# Patient Record
Sex: Male | Born: 1985
Health system: Southern US, Community
[De-identification: ages and names within clinical notes are randomized; demographics above are authoritative.]

## PROBLEM LIST (undated history)

## (undated) DIAGNOSIS — R251 Tremor, unspecified: Secondary | ICD-10-CM

## (undated) DIAGNOSIS — G8929 Other chronic pain: Secondary | ICD-10-CM

## (undated) DIAGNOSIS — F4322 Adjustment disorder with anxiety: Secondary | ICD-10-CM

## (undated) DIAGNOSIS — F319 Bipolar disorder, unspecified: Secondary | ICD-10-CM

## (undated) DIAGNOSIS — F419 Anxiety disorder, unspecified: Secondary | ICD-10-CM

## (undated) DIAGNOSIS — R1011 Right upper quadrant pain: Secondary | ICD-10-CM

## (undated) DIAGNOSIS — F445 Conversion disorder with seizures or convulsions: Secondary | ICD-10-CM

## (undated) DIAGNOSIS — G479 Sleep disorder, unspecified: Secondary | ICD-10-CM

## (undated) DIAGNOSIS — F1111 Opioid abuse, in remission: Secondary | ICD-10-CM

## (undated) DIAGNOSIS — S060XAA Concussion with loss of consciousness status unknown, initial encounter: Secondary | ICD-10-CM

## (undated) DIAGNOSIS — R109 Unspecified abdominal pain: Secondary | ICD-10-CM

## (undated) DIAGNOSIS — F988 Other specified behavioral and emotional disorders with onset usually occurring in childhood and adolescence: Secondary | ICD-10-CM

## (undated) DIAGNOSIS — S060X9A Concussion with loss of consciousness of unspecified duration, initial encounter: Secondary | ICD-10-CM

## (undated) DIAGNOSIS — R404 Transient alteration of awareness: Secondary | ICD-10-CM

## (undated) DIAGNOSIS — M549 Dorsalgia, unspecified: Secondary | ICD-10-CM

## (undated) DIAGNOSIS — Z8249 Family history of ischemic heart disease and other diseases of the circulatory system: Secondary | ICD-10-CM

## (undated) DIAGNOSIS — M5137 Other intervertebral disc degeneration, lumbosacral region: Secondary | ICD-10-CM

## (undated) DIAGNOSIS — R51 Headache: Secondary | ICD-10-CM

## (undated) DIAGNOSIS — F329 Major depressive disorder, single episode, unspecified: Secondary | ICD-10-CM

## (undated) DIAGNOSIS — E7800 Pure hypercholesterolemia, unspecified: Secondary | ICD-10-CM

## (undated) DIAGNOSIS — F339 Major depressive disorder, recurrent, unspecified: Secondary | ICD-10-CM

## (undated) DIAGNOSIS — R319 Hematuria, unspecified: Secondary | ICD-10-CM

## (undated) DIAGNOSIS — Z9089 Acquired absence of other organs: Secondary | ICD-10-CM

## (undated) DIAGNOSIS — R569 Unspecified convulsions: Secondary | ICD-10-CM

## (undated) HISTORY — DX: Family history of ischemic heart disease and other diseases of the circulatory system: Z82.49

## (undated) HISTORY — DX: Anxiety disorder, unspecified: F41.9

## (undated) HISTORY — PX: TONSILLECTOMY: SUR1361

## (undated) HISTORY — PX: TYMPANOSTOMY TUBE PLACEMENT: SHX32

## (undated) HISTORY — DX: Other intervertebral disc degeneration, lumbosacral region: M51.37

## (undated) HISTORY — DX: Acquired absence of other organs: Z90.89

## (undated) HISTORY — DX: Unspecified abdominal pain: R10.9

## (undated) HISTORY — DX: Right upper quadrant pain: R10.11

## (undated) HISTORY — DX: Other specified behavioral and emotional disorders with onset usually occurring in childhood and adolescence: F98.8

## (undated) HISTORY — DX: Pure hypercholesterolemia, unspecified: E78.00

## (undated) HISTORY — DX: Headache: R51

## (undated) HISTORY — DX: Concussion with loss of consciousness status unknown, initial encounter: S06.0XAA

## (undated) HISTORY — DX: Adjustment disorder with anxiety: F43.22

## (undated) HISTORY — DX: Major depressive disorder, recurrent, unspecified: F33.9

## (undated) HISTORY — DX: Hematuria, unspecified: R31.9

## (undated) HISTORY — DX: Conversion disorder with seizures or convulsions: F44.5

## (undated) HISTORY — DX: Transient alteration of awareness: R40.4

## (undated) HISTORY — DX: Major depressive disorder, single episode, unspecified: F32.9

## (undated) HISTORY — DX: Opioid abuse, in remission: F11.11

## (undated) HISTORY — DX: Bipolar disorder, unspecified: F31.9

## (undated) HISTORY — DX: Concussion with loss of consciousness of unspecified duration, initial encounter: S06.0X9A

## (undated) HISTORY — DX: Tremor, unspecified: R25.1

## (undated) HISTORY — DX: Sleep disorder, unspecified: G47.9

---
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
## 2008-04-21 DIAGNOSIS — G479 Sleep disorder, unspecified: Secondary | ICD-10-CM

## 2008-04-21 HISTORY — DX: Sleep disorder, unspecified: G47.9

## 2008-08-12 ENCOUNTER — Emergency Department (HOSPITAL_COMMUNITY): Admission: EM | Admit: 2008-08-12 | Discharge: 2008-08-12 | Payer: Self-pay | Admitting: Emergency Medicine

## 2009-11-05 DIAGNOSIS — F319 Bipolar disorder, unspecified: Secondary | ICD-10-CM

## 2009-11-05 HISTORY — DX: Bipolar disorder, unspecified: F31.9

## 2009-12-23 ENCOUNTER — Ambulatory Visit: Payer: Self-pay | Admitting: Diagnostic Radiology

## 2009-12-23 ENCOUNTER — Emergency Department (HOSPITAL_BASED_OUTPATIENT_CLINIC_OR_DEPARTMENT_OTHER): Admission: EM | Admit: 2009-12-23 | Discharge: 2009-12-23 | Payer: Self-pay | Admitting: Emergency Medicine

## 2009-12-26 ENCOUNTER — Emergency Department (HOSPITAL_BASED_OUTPATIENT_CLINIC_OR_DEPARTMENT_OTHER): Admission: EM | Admit: 2009-12-26 | Discharge: 2009-12-27 | Payer: Self-pay | Admitting: Emergency Medicine

## 2009-12-26 ENCOUNTER — Ambulatory Visit: Payer: Self-pay | Admitting: Radiology

## 2009-12-27 ENCOUNTER — Ambulatory Visit: Payer: Self-pay | Admitting: Diagnostic Radiology

## 2010-02-03 ENCOUNTER — Emergency Department (HOSPITAL_BASED_OUTPATIENT_CLINIC_OR_DEPARTMENT_OTHER): Admission: EM | Admit: 2010-02-03 | Discharge: 2010-02-03 | Payer: Self-pay | Admitting: Emergency Medicine

## 2010-02-03 ENCOUNTER — Ambulatory Visit: Payer: Self-pay | Admitting: Diagnostic Radiology

## 2010-04-21 ENCOUNTER — Emergency Department (HOSPITAL_BASED_OUTPATIENT_CLINIC_OR_DEPARTMENT_OTHER): Admission: EM | Admit: 2010-04-21 | Discharge: 2010-04-21 | Payer: Self-pay | Admitting: Emergency Medicine

## 2010-04-21 ENCOUNTER — Ambulatory Visit: Payer: Self-pay | Admitting: Diagnostic Radiology

## 2010-06-04 ENCOUNTER — Emergency Department (HOSPITAL_BASED_OUTPATIENT_CLINIC_OR_DEPARTMENT_OTHER): Admission: EM | Admit: 2010-06-04 | Discharge: 2010-06-05 | Payer: Self-pay | Admitting: Emergency Medicine

## 2010-06-04 ENCOUNTER — Ambulatory Visit: Payer: Self-pay | Admitting: Diagnostic Radiology

## 2010-06-14 ENCOUNTER — Ambulatory Visit: Payer: Self-pay | Admitting: Diagnostic Radiology

## 2010-06-14 ENCOUNTER — Emergency Department (HOSPITAL_BASED_OUTPATIENT_CLINIC_OR_DEPARTMENT_OTHER): Admission: EM | Admit: 2010-06-14 | Discharge: 2010-06-14 | Payer: Self-pay | Admitting: Emergency Medicine

## 2010-06-17 ENCOUNTER — Ambulatory Visit: Payer: Self-pay | Admitting: Diagnostic Radiology

## 2010-06-17 ENCOUNTER — Emergency Department (HOSPITAL_BASED_OUTPATIENT_CLINIC_OR_DEPARTMENT_OTHER): Admission: EM | Admit: 2010-06-17 | Discharge: 2010-06-17 | Payer: Self-pay | Admitting: Emergency Medicine

## 2010-08-17 IMAGING — CT CT HEAD W/O CM
1 series · 16 of 30 positions shown, 20 images · non-contrast
Comparison: 08/12/2008.

CLINICAL DATA: Seizure.  History of seizures.

CT HEAD WITHOUT CONTRAST
TECHNIQUE: Contiguous axial images were obtained from the base of
the skull through the vertex without contrast.

[Series 2: head 4.8 h37s · axial · 0.47mm/px · z∈[-147,+13]mm · 16 of 36 slices shown, 20 images]
[im 2/36  brain]
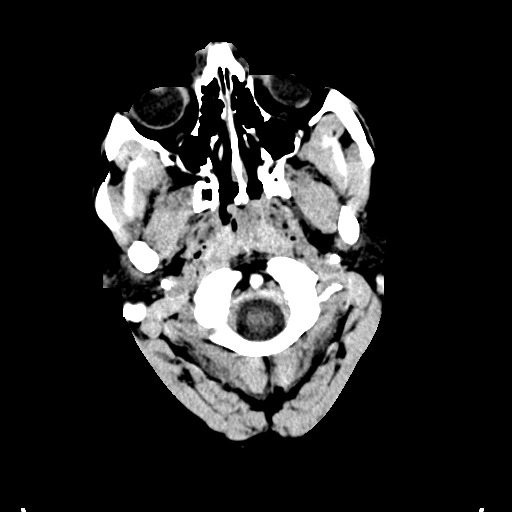
[im 2/36  bone]
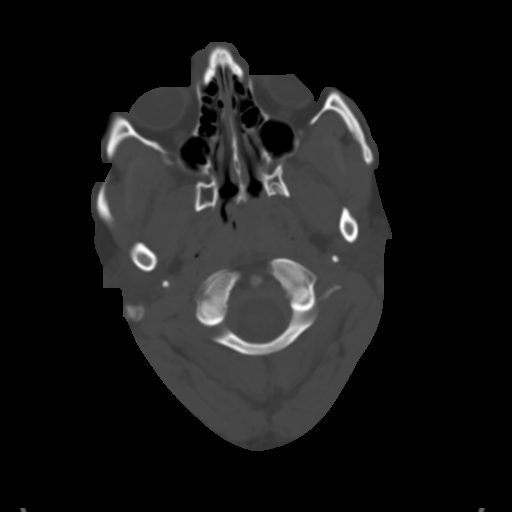
[im 4/36  brain]
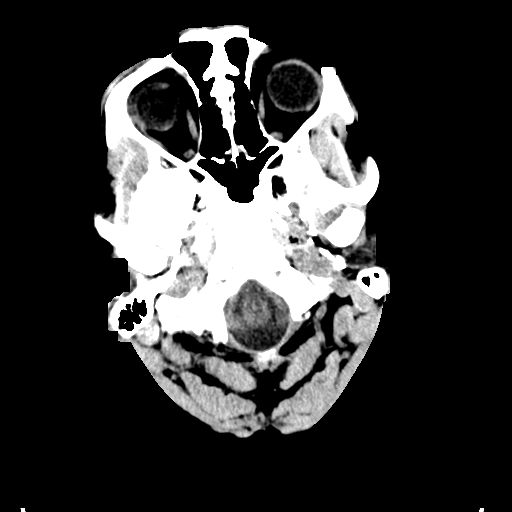
[im 7/36  brain]
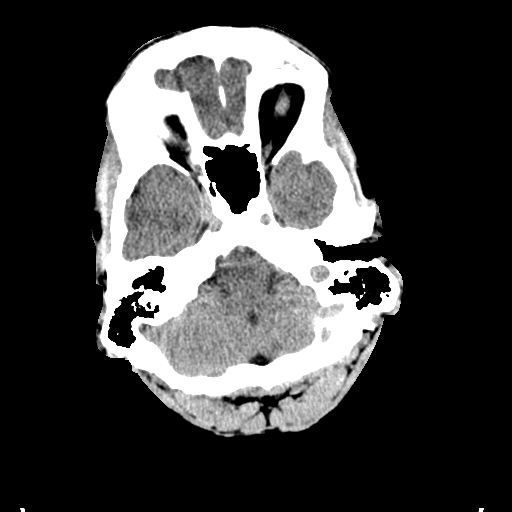
[im 9/36  brain]
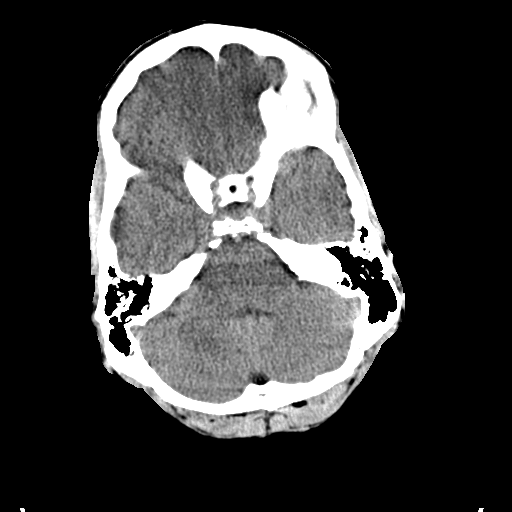
[im 10/36  brain]
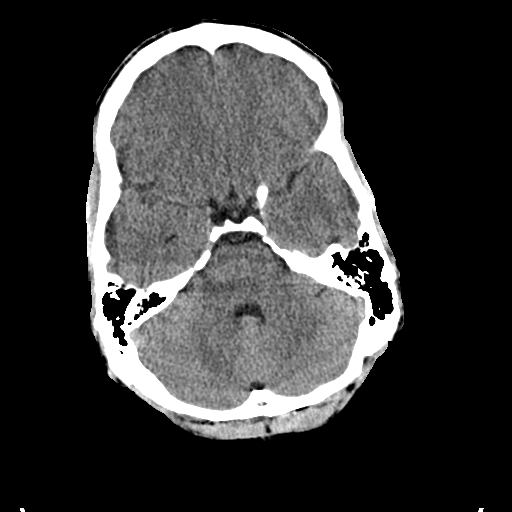
[im 10/36  bone]
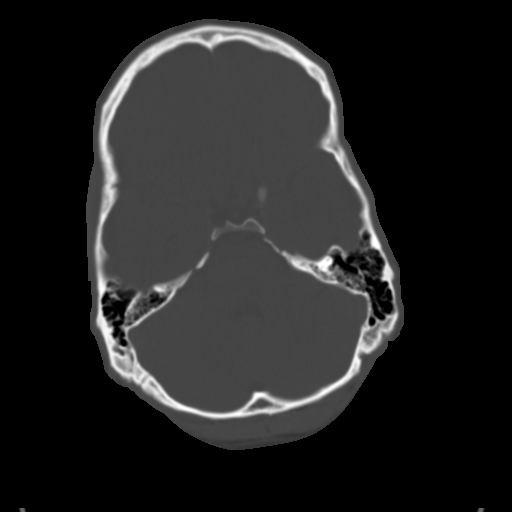
[im 13/36  brain]
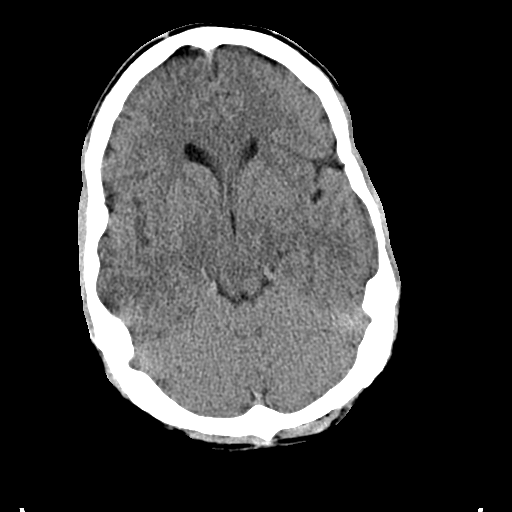
[im 15/36  brain]
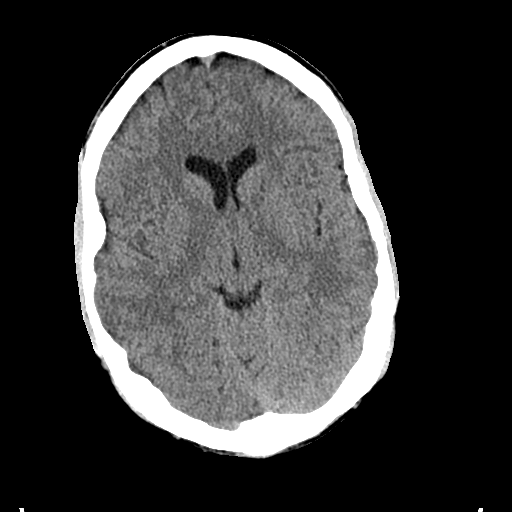
[im 17/36  brain]
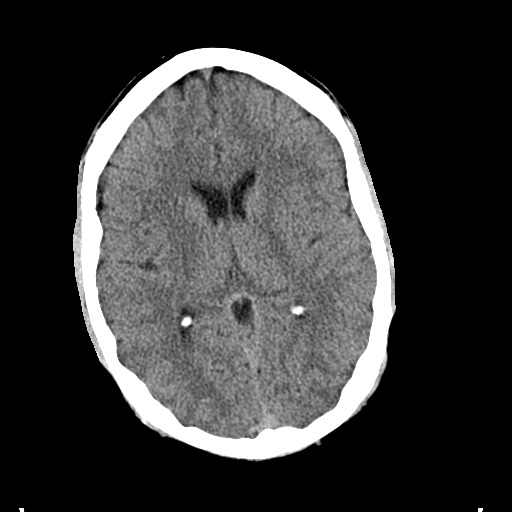
[im 19/36  brain]
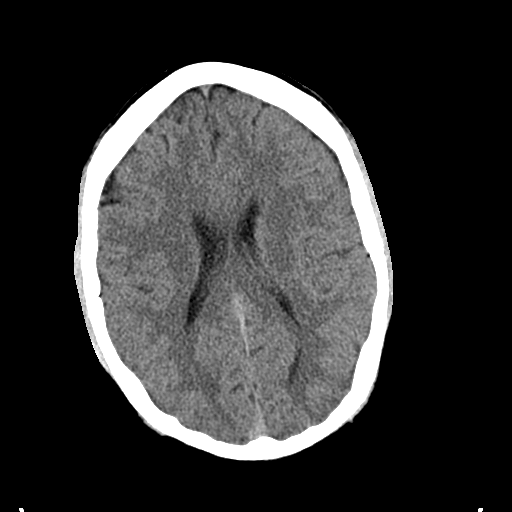
[im 19/36  bone]
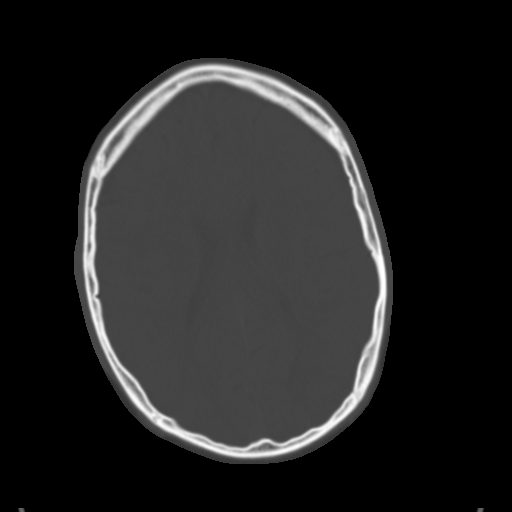
[im 21/36  brain]
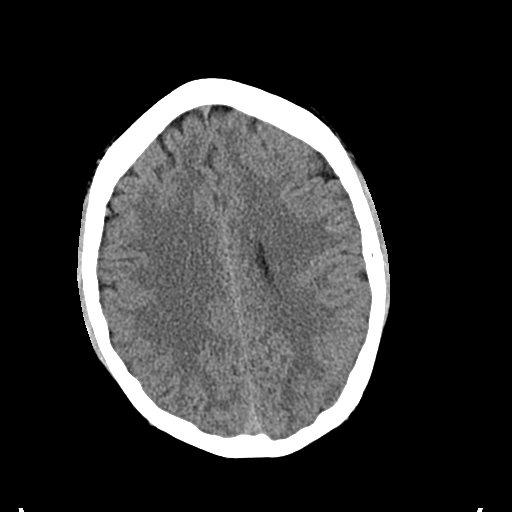
[im 23/36  brain]
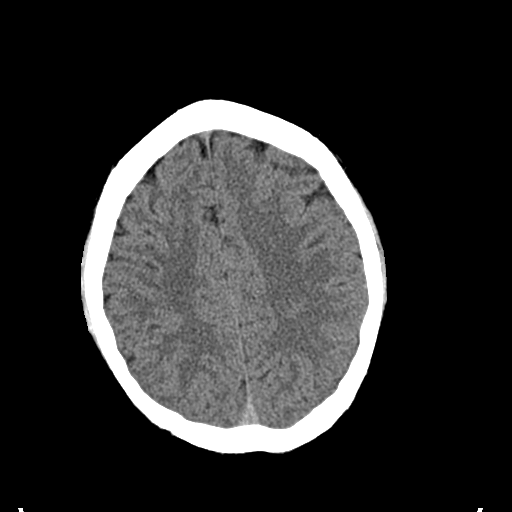
[im 26/36  brain]
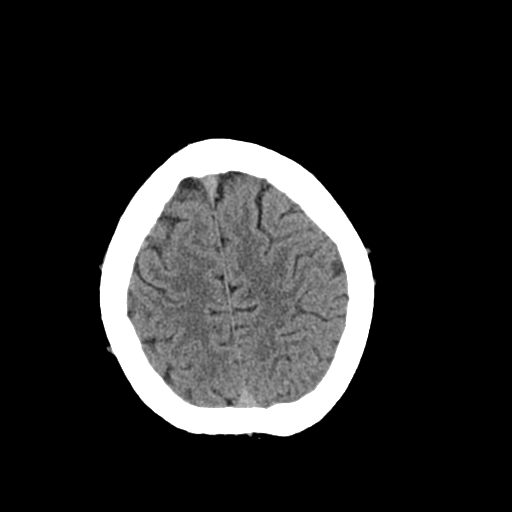
[im 27/36  brain]
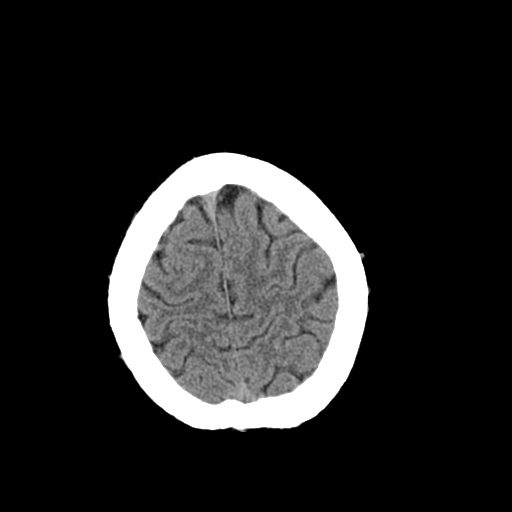
[im 27/36  bone]
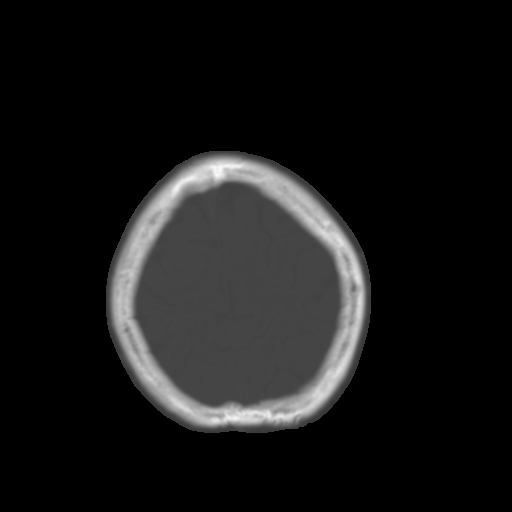
[im 29/36  brain]
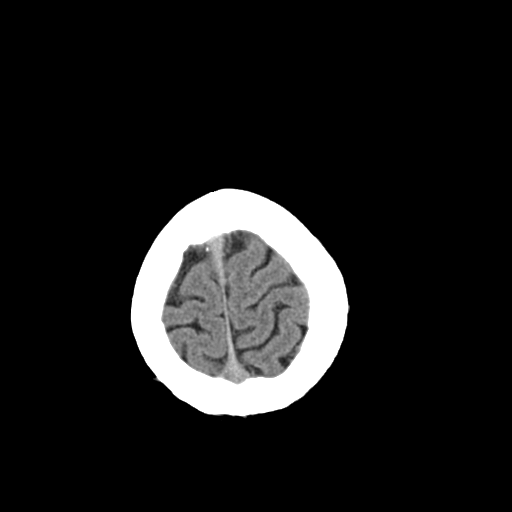
[im 32/36  brain]
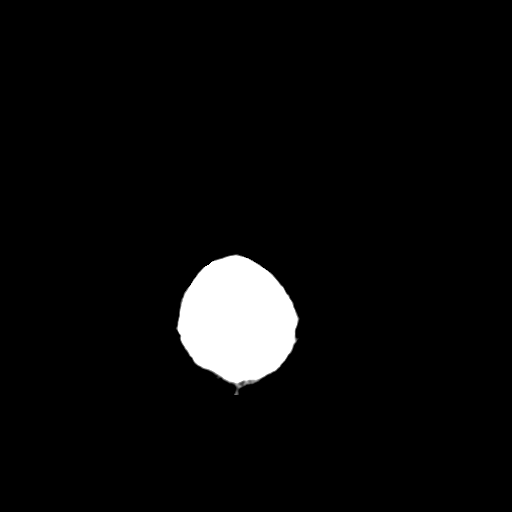
[im 34/36  brain]
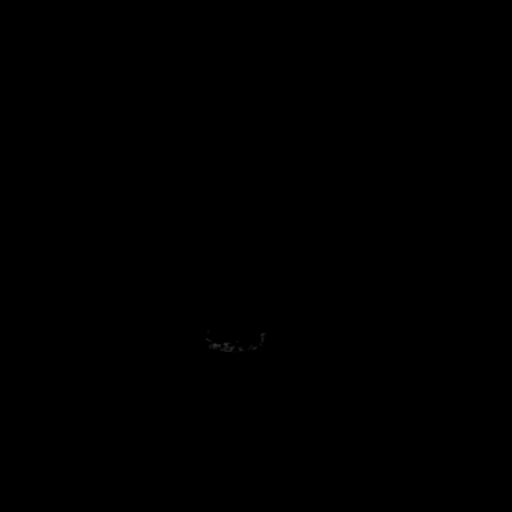

[16 of 30 positions shown; findings below may reference images not displayed]

FINDINGS: Normal appearing cerebral hemispheres and posterior fossa
structures.  Normal size and position of the ventricles.  No
intracranial hemorrhage, mass lesion or evidence of acute
infarction.  Unremarkable bones and included portions of the
paranasal sinuses.
IMPRESSION: Normal examination.

## 2010-08-17 IMAGING — CR DG SHOULDER 2+V*R*
3 series · 3 of 3 positions shown · non-contrast
Comparison: None.

CLINICAL DATA: Right shoulder pain following a fall.

RIGHT SHOULDER - 2+ VIEW

[w shoulder ap internal righ]
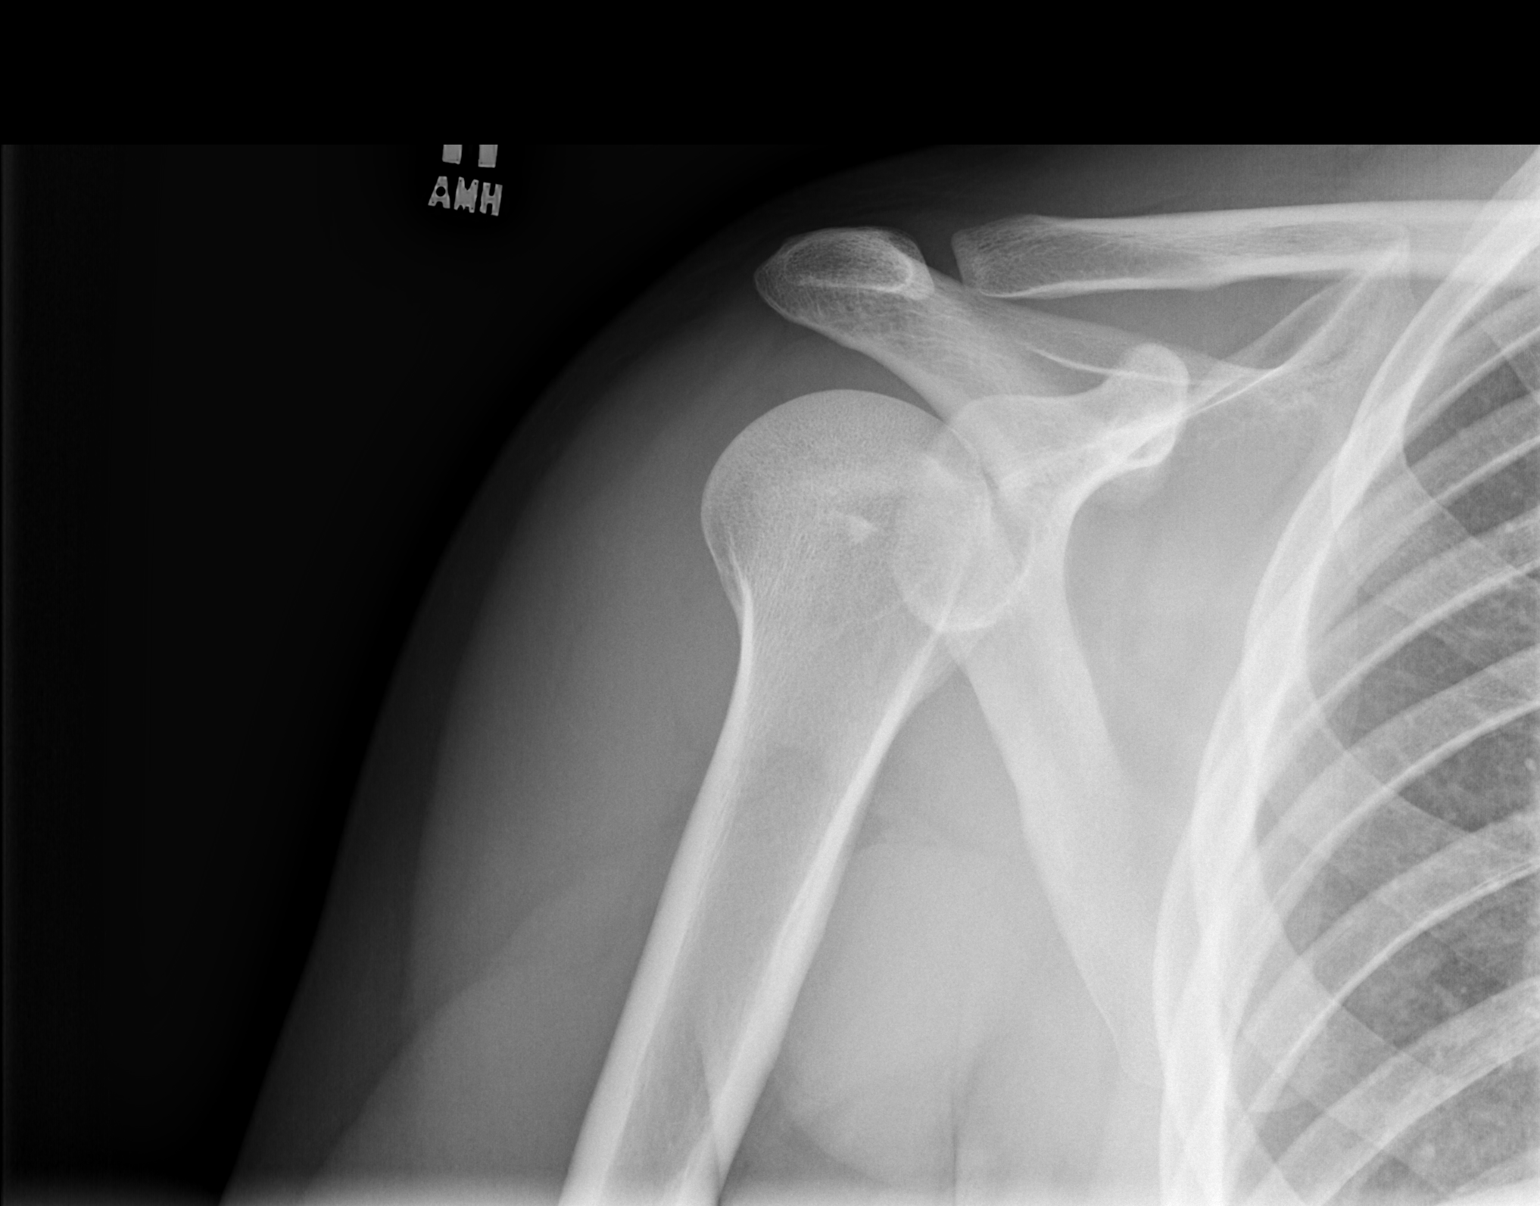

[w shoulder ap external righ]
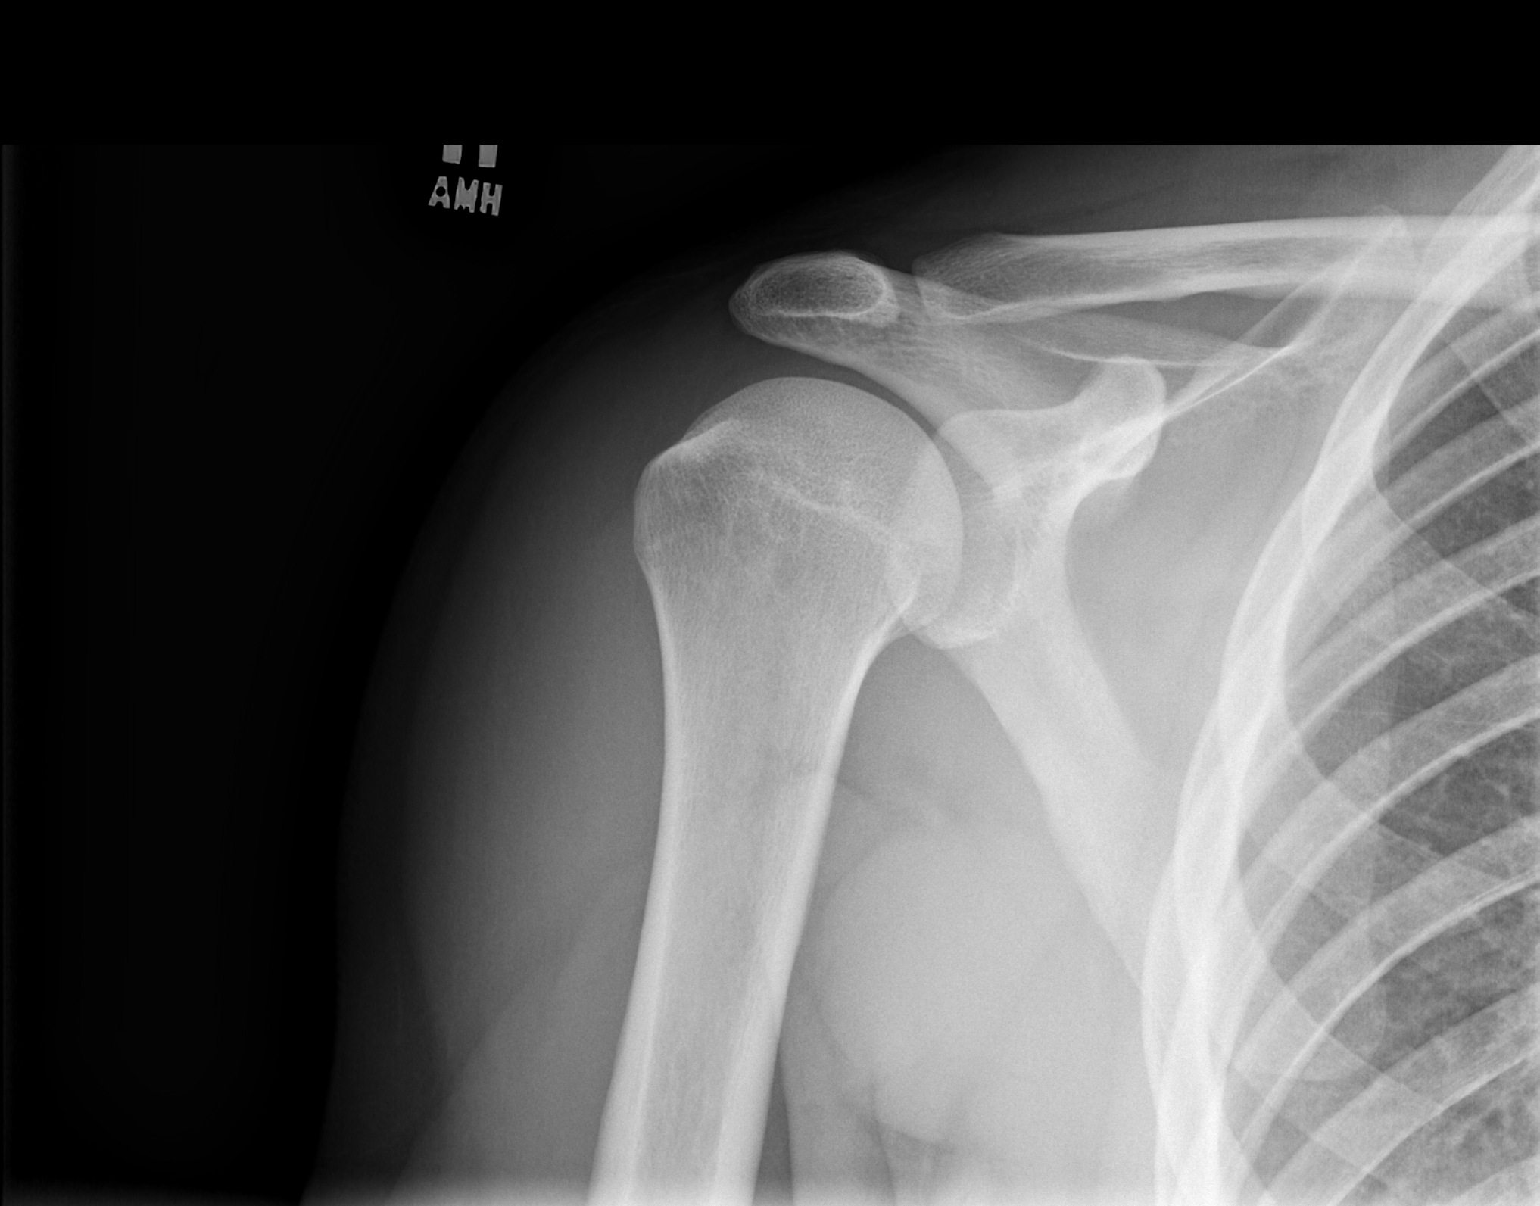

[w shoulder y view right]
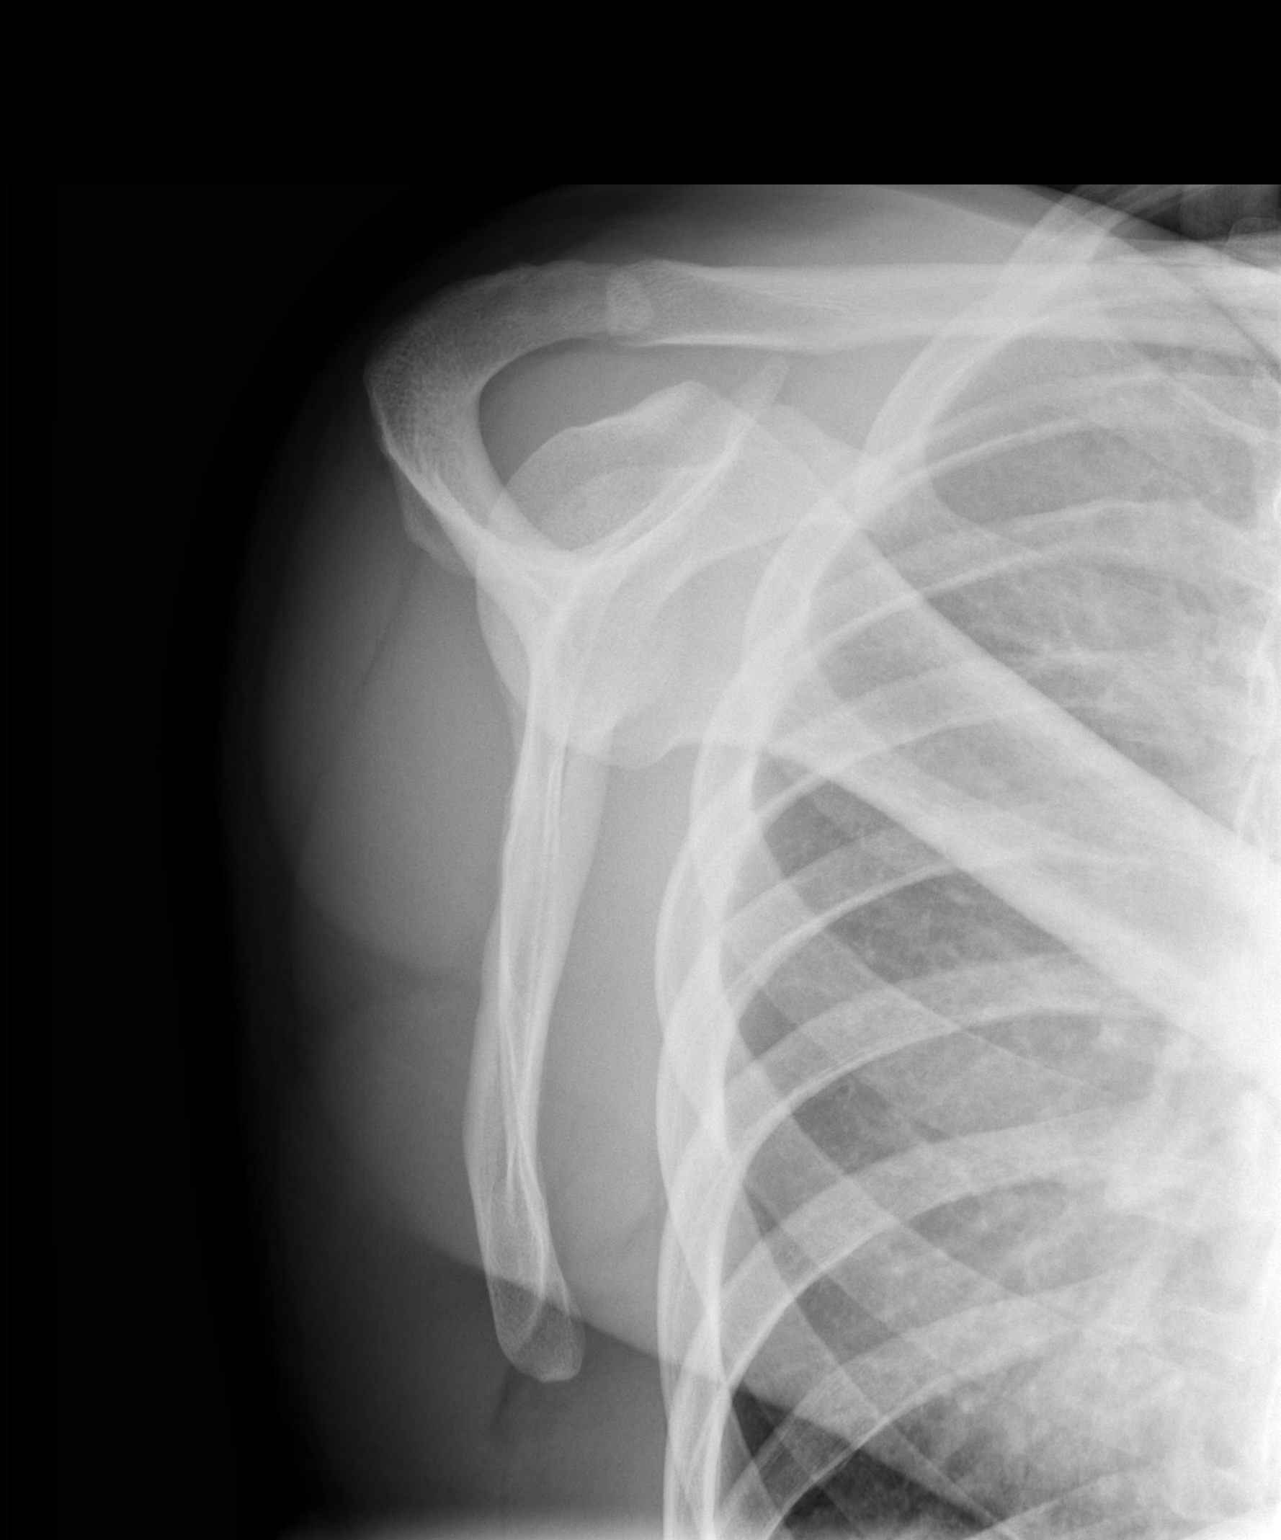

[3 of 3 positions shown; findings below may reference images not displayed]

FINDINGS: Small bone island in the right humeral head.  No fracture
or dislocation.
IMPRESSION: Normal examination.

## 2010-11-14 LAB — BASIC METABOLIC PANEL
BUN: 13 mg/dL (ref 6–23)
CO2: 26 mEq/L (ref 19–32)
Calcium: 9.5 mg/dL (ref 8.4–10.5)
Chloride: 105 mEq/L (ref 96–112)
Creatinine, Ser: 1 mg/dL (ref 0.4–1.5)
GFR calc Af Amer: >60 mL/min (ref >60)
GFR calc non Af Amer: >60 mL/min (ref >60)
Glucose, Bld: 104 mg/dL — ABNORMAL HIGH (ref 70–99)
Potassium: 4.5 mEq/L (ref 3.5–5.1)
Sodium: 140 mEq/L (ref 135–145)

## 2011-06-06 LAB — DIFFERENTIAL
Basophils Absolute: 0 10*3/uL (ref 0.0–0.1)
Basophils Relative: 0 % (ref 0–1)
Eosinophils Absolute: 0.1 10*3/uL (ref 0.0–0.7)
Eosinophils Relative: 2 % (ref 0–5)
Lymphocytes Relative: 29 % (ref 12–46)
Lymphs Abs: 1.4 10*3/uL (ref 0.7–4.0)
Monocytes Absolute: 0.4 10*3/uL (ref 0.1–1.0)
Monocytes Relative: 8 % (ref 3–12)
Neutro Abs: 2.9 10*3/uL (ref 1.7–7.7)
Neutrophils Relative %: 61 % (ref 43–77)

## 2011-06-06 LAB — URINALYSIS, ROUTINE W REFLEX MICROSCOPIC
Bilirubin Urine: NEGATIVE
Glucose, UA: NEGATIVE mg/dL
Hgb urine dipstick: NEGATIVE
Ketones, ur: NEGATIVE mg/dL
Nitrite: NEGATIVE
Protein, ur: NEGATIVE mg/dL
Specific Gravity, Urine: 1.007 (ref 1.005–1.030)
Urobilinogen, UA: 0.2 mg/dL (ref 0.0–1.0)
pH: 6.5 (ref 5.0–8.0)

## 2011-06-06 LAB — CBC
HCT: 42.9 % (ref 39.0–52.0)
Hemoglobin: 14 g/dL (ref 13.0–17.0)
MCHC: 32.7 g/dL (ref 30.0–36.0)
MCV: 80.5 fL (ref 78.0–100.0)
Platelets: 225 10*3/uL (ref 150–400)
RBC: 5.33 MIL/uL (ref 4.22–5.81)
RDW: 13.4 % (ref 11.5–15.5)
WBC: 4.7 10*3/uL (ref 4.0–10.5)

## 2011-06-06 LAB — RAPID URINE DRUG SCREEN, HOSP PERFORMED
Amphetamines: NOT DETECTED
Barbiturates: NOT DETECTED
Benzodiazepines: POSITIVE — AB
Cocaine: NOT DETECTED
Opiates: NOT DETECTED
Tetrahydrocannabinol: NOT DETECTED

## 2011-06-06 LAB — BASIC METABOLIC PANEL
BUN: 5 mg/dL — ABNORMAL LOW (ref 6–23)
CO2: 29 mEq/L (ref 19–32)
Calcium: 8.9 mg/dL (ref 8.4–10.5)
Chloride: 100 mEq/L (ref 96–112)
Creatinine, Ser: 1.15 mg/dL (ref 0.4–1.5)
GFR calc Af Amer: >60 mL/min (ref >60)
GFR calc non Af Amer: >60 mL/min (ref >60)
Glucose, Bld: 95 mg/dL (ref 70–99)
Potassium: 4.2 mEq/L (ref 3.5–5.1)
Sodium: 134 mEq/L — ABNORMAL LOW (ref 135–145)

## 2011-06-06 LAB — ETHANOL: Alcohol, Ethyl (B): <5 mg/dL (ref 0–10)

## 2011-10-22 DIAGNOSIS — R109 Unspecified abdominal pain: Secondary | ICD-10-CM

## 2011-10-22 HISTORY — DX: Unspecified abdominal pain: R10.9

## 2011-11-05 DIAGNOSIS — K2970 Gastritis, unspecified, without bleeding: Secondary | ICD-10-CM | POA: Insufficient documentation

## 2012-11-24 DIAGNOSIS — R319 Hematuria, unspecified: Secondary | ICD-10-CM

## 2012-11-24 HISTORY — DX: Hematuria, unspecified: R31.9

## 2012-12-09 DIAGNOSIS — R1011 Right upper quadrant pain: Secondary | ICD-10-CM

## 2012-12-09 HISTORY — DX: Right upper quadrant pain: R10.11

## 2013-02-01 DIAGNOSIS — K625 Hemorrhage of anus and rectum: Secondary | ICD-10-CM | POA: Insufficient documentation

## 2013-02-05 DIAGNOSIS — F339 Major depressive disorder, recurrent, unspecified: Secondary | ICD-10-CM

## 2013-02-05 HISTORY — DX: Major depressive disorder, recurrent, unspecified: F33.9

## 2014-05-01 DIAGNOSIS — R404 Transient alteration of awareness: Secondary | ICD-10-CM

## 2014-05-01 HISTORY — DX: Transient alteration of awareness: R40.4

## 2016-10-14 DIAGNOSIS — R251 Tremor, unspecified: Secondary | ICD-10-CM

## 2016-10-14 HISTORY — DX: Tremor, unspecified: R25.1

## 2016-10-15 DIAGNOSIS — G40909 Epilepsy, unspecified, not intractable, without status epilepticus: Secondary | ICD-10-CM | POA: Insufficient documentation

## 2016-12-02 ENCOUNTER — Other Ambulatory Visit: Payer: Self-pay | Admitting: Nurse Practitioner

## 2017-03-29 ENCOUNTER — Encounter (HOSPITAL_COMMUNITY): Payer: Self-pay | Admitting: Emergency Medicine

## 2017-03-29 ENCOUNTER — Emergency Department (HOSPITAL_COMMUNITY): Payer: Medicaid Other

## 2017-03-29 ENCOUNTER — Emergency Department (HOSPITAL_COMMUNITY)
Admission: EM | Admit: 2017-03-29 | Discharge: 2017-03-29 | Disposition: A | Discharge status: Home / Self Care | Payer: Medicaid Other | Attending: Emergency Medicine | Admitting: Emergency Medicine

## 2017-03-29 DIAGNOSIS — W19XXXA Unspecified fall, initial encounter: Secondary | ICD-10-CM | POA: Diagnosis not present

## 2017-03-29 DIAGNOSIS — Y92002 Bathroom of unspecified non-institutional (private) residence single-family (private) house as the place of occurrence of the external cause: Secondary | ICD-10-CM | POA: Insufficient documentation

## 2017-03-29 DIAGNOSIS — S161XXA Strain of muscle, fascia and tendon at neck level, initial encounter: Secondary | ICD-10-CM | POA: Diagnosis not present

## 2017-03-29 DIAGNOSIS — Y999 Unspecified external cause status: Secondary | ICD-10-CM | POA: Diagnosis not present

## 2017-03-29 DIAGNOSIS — Y9389 Activity, other specified: Secondary | ICD-10-CM | POA: Diagnosis not present

## 2017-03-29 DIAGNOSIS — G40909 Epilepsy, unspecified, not intractable, without status epilepticus: Secondary | ICD-10-CM | POA: Insufficient documentation

## 2017-03-29 DIAGNOSIS — R569 Unspecified convulsions: Secondary | ICD-10-CM

## 2017-03-29 HISTORY — DX: Unspecified convulsions: R56.9

## 2017-03-29 LAB — BASIC METABOLIC PANEL
Anion gap: 6 (ref 5–15)
BUN: 5 mg/dL — AB (ref 6–20)
CALCIUM: 9.1 mg/dL (ref 8.9–10.3)
CHLORIDE: 104 mmol/L (ref 101–111)
CO2: 30 mmol/L (ref 22–32)
CREATININE: 1 mg/dL (ref 0.61–1.24)
GFR calc Af Amer: >60 mL/min (ref >60)
GFR calc non Af Amer: >60 mL/min (ref >60)
Glucose, Bld: 91 mg/dL (ref 65–99)
Potassium: 3.4 mmol/L — ABNORMAL LOW (ref 3.5–5.1)
SODIUM: 140 mmol/L (ref 135–145)

## 2017-03-29 LAB — CBC WITH DIFFERENTIAL/PLATELET
BASOS ABS: 0 10*3/uL (ref 0.0–0.1)
BASOS PCT: 0 %
EOS ABS: 0.1 10*3/uL (ref 0.0–0.7)
Eosinophils Relative: 1 %
HEMATOCRIT: 42 % (ref 39.0–52.0)
HEMOGLOBIN: 14.1 g/dL (ref 13.0–17.0)
Lymphocytes Relative: 31 %
Lymphs Abs: 2 10*3/uL (ref 0.7–4.0)
MCH: 26.4 pg (ref 26.0–34.0)
MCHC: 33.6 g/dL (ref 30.0–36.0)
MCV: 78.7 fL (ref 78.0–100.0)
Monocytes Absolute: 0.4 10*3/uL (ref 0.1–1.0)
Monocytes Relative: 7 %
NEUTROS ABS: 3.9 10*3/uL (ref 1.7–7.7)
NEUTROS PCT: 61 %
Platelets: 276 10*3/uL (ref 150–400)
RBC: 5.34 MIL/uL (ref 4.22–5.81)
RDW: 13.8 % (ref 11.5–15.5)
WBC: 6.4 10*3/uL (ref 4.0–10.5)

## 2017-03-29 MED ORDER — METOCLOPRAMIDE HCL 5 MG/ML IJ SOLN
10.0000 mg | Freq: Once | INTRAMUSCULAR | Status: AC
  Administered 2017-03-29: 10 mg via INTRAVENOUS
  Filled 2017-03-29: qty 2

## 2017-03-29 MED ORDER — DIPHENHYDRAMINE HCL 50 MG/ML IJ SOLN
25.0000 mg | Freq: Once | INTRAMUSCULAR | Status: AC
  Administered 2017-03-29: 25 mg via INTRAVENOUS
  Filled 2017-03-29: qty 1

## 2017-03-29 MED ORDER — LORAZEPAM 2 MG/ML IJ SOLN
1.0000 mg | Freq: Once | INTRAMUSCULAR | Status: AC
  Administered 2017-03-29: 1 mg via INTRAVENOUS
  Filled 2017-03-29: qty 1

## 2017-03-29 MED ORDER — ACETAMINOPHEN 325 MG PO TABS
650.0000 mg | ORAL_TABLET | Freq: Once | ORAL | Status: AC
  Administered 2017-03-29: 650 mg via ORAL
  Filled 2017-03-29: qty 2

## 2017-03-29 NOTE — ED Notes (Signed)
Pt ambulated in hall without difficulty.

## 2017-03-29 NOTE — ED Provider Notes (Signed)
[facility]-EMERGENCY DEPT Provider Note   CSN: [id number] Arrival date & time: 03/29/17  0003  By signing my name below, I, Ny'Kea Lewis, attest that this documentation has been prepared under the direction and in the presence of Zadie RhineWickline, Mikaella Escalona, MD. Electronically Signed: Karren CobbleNy'Kea Lewis, ED Scribe. 03/29/17. 12:40 AM.  History   Chief Complaint Chief Complaint  Patient presents with  . Seizures  . Fall   The history is provided by the patient and a relative. No language interpreter was used.  Seizures   This is a recurrent problem. The current episode started less than 1 hour ago. The problem has been gradually improving. There were 2 to 3 seizures. The most recent episode lasted more than 5 minutes. Associated symptoms include headaches. Characteristics include bladder incontinence, rhythmic jerking and bit tongue. The episode was witnessed. The seizures did not continue in the ED.  Fall  This is a new problem. The current episode started less than 1 hour ago. The problem occurs rarely. The problem has been gradually improving. Associated symptoms include headaches.    HPI HPI Comments: Delila SpenceZachary Lowrey is a 31 y.o. male with a PMHx of seizures, brought in by ambulance, who presents to the Emergency Department for evaluation after having three seizures and falling while in bathroom. He notes associated headache,   and neck pain. Pt reports he is unable to remember exactly what happened. Per pt's wife and sister, he was found in the bathroom crouched over the toilet. At this time they witnessed the patient have two seizures. They report afterwards he was more confused that normal and was unable to decipher who they were. They report he has multiple seizures everyday, but this was unlike his normal. Pt reports he is on Valium for his seizures, but he was unable to take the medication prior to the onset of his seizures. He [facility]. No acute associated  symptoms noted at this time.    Past Medical History:  Diagnosis Date  . Seizures (HCC)     There are no active problems to display for this patient.  Past Surgical History:  Procedure Laterality Date  . TONSILLECTOMY      Home Medications    Prior to Admission medications   Not on File   Family History No family history on file.  Social History Social History  Substance Use Topics  . Smoking status: Never Smoker  . Smokeless tobacco: Current User    Types: Snuff  . Alcohol use No    Allergies   Tramadol  Review of Systems Review of Systems  Genitourinary: Positive for bladder incontinence.  Musculoskeletal: Positive for neck pain.  Neurological: Positive for seizures and headaches.  All other systems reviewed and are negative.   Physical Exam Updated Vital Signs BP 109/61   Pulse 63   Temp (!) 97.3 F (36.3 C) (Oral)   Resp 19   SpO2 100%   Physical Exam CONSTITUTIONAL: Well developed/well nourished HEAD: Normocephalic/atraumatic EYES: EOMI/PERRL ENMT: Mucous membranes moist, no tongue lacerations, No evidence of facial/nasal trauma SPINE/BACK:cervical spine tenderness, No bruising/crepitance/stepoffs noted to spine Mild lumbar tenderness that is chronic per patient CV: S1/S2 noted, no murmurs/rubs/gallops noted LUNGS: Lungs are clear to auscultation bilaterally, no apparent distress ABDOMEN: soft, nontender, no rebound or guarding, bowel sounds noted throughout abdomen GU:no cva tenderness NEURO: Pt is awake/alert/appropriate, moves all extremitiesx4.  No facial droop.  GCS 15 EXTREMITIES: pulses normal/equal, full ROM, no tenderness, full ROM  noted SKIN: warm, color normal PSYCH: no abnormalities of mood noted, alert and oriented to situation   ED Treatments / Results  DIAGNOSTIC STUDIES: Oxygen Saturation is 99% on RA, normal by my interpretation.   COORDINATION OF CARE: 12:23 AM-Discussed next steps with pt. Pt verbalized understanding  and is agreeable with the plan.   Labs (all labs ordered are listed, but only abnormal results are displayed) Labs Reviewed  BASIC METABOLIC PANEL - Abnormal; Notable for the following:       Result Value   Potassium 3.4 (*)    BUN 5 (*)    All other components within normal limits  CBC WITH DIFFERENTIAL/PLATELET    EKG  EKG Interpretation  Date/Time:  Sunday March 29 2017 00:17:52 EDT Ventricular Rate:  75 PR Interval:    QRS Duration: 97 QT Interval:  387 QTC Calculation: 433 R Axis:   83 Text Interpretation:  Sinus rhythm No significant change since last tracing Confirmed by Zadie RhineWickline, Shaylynn Nulty ([id number]) on 03/29/2017 12:29:40 AM       Radiology Dg Cervical Spine Complete  Result Date: 03/29/2017 CLINICAL DATA:  Seizures and fall tonight. Persistent headache and neck pain. EXAM: CERVICAL SPINE - COMPLETE 4+ VIEW COMPARISON:  None. FINDINGS: Slight reversal of cervical lordosis at C4. The cervical vertebrae are normal in height. No evidence of acute fracture. No arthritic changes. No bone lesion or bony destruction. IMPRESSION: Negative cervical spine radiographs. Electronically Signed   By: Ellery Plunkaniel R Mitchell M.D.   On: 03/29/2017 01:10    Procedures Procedures (including critical care time)  Medications Ordered in ED Medications  acetaminophen (TYLENOL) tablet 650 mg (650 mg Oral Given 03/29/17 0109)  LORazepam (ATIVAN) injection 1 mg (1 mg Intravenous Given 03/29/17 0109)  metoCLOPramide (REGLAN) injection 10 mg (10 mg Intravenous Given 03/29/17 0143)  diphenhydrAMINE (BENADRYL) injection 25 mg (25 mg Intravenous Given 03/29/17 0142)     Initial Impression / Assessment and Plan / ED Course  I have reviewed the triage vital signs and the nursing notes.  Pertinent labs & imaging results that were available during my care of the patient were reviewed by me and considered in my medical decision making (see chart for details).     Imaging/labs reassuring Pt improved He is  ambulatory No SZ activity here He feels improved HA improved No signs of head injury No focal weakness He has long h/o seizures, and is comfortable managing this at home [facility] We discussed strict seizure precautions and to avoid driving/bathing alone/swimming alone Patient/wife agreeable  Final Clinical Impressions(s) / ED Diagnoses   Final diagnoses:  Seizure (HCC)  Strain of neck muscle, initial encounter    New Prescriptions New Prescriptions   No medications on file  I personally performed the services described in this documentation, which was scribed in my presence. The recorded information has been reviewed and is accurate.        Zadie RhineWickline, Reiley Keisler, MD 03/29/17 [phone]

## 2017-03-29 NOTE — ED Triage Notes (Signed)
Per EMS pt was visiting family tonight and had 2-3 witnessed seizures over the course of 10 min.  Pt has a history of seizures since a baseball injury to the head at age 31.  Pt fell tonight injuring neck, left shoulder, and lower back.  Pt states he has 10/10 headache at this time. Family states has had increase in seizure activity the last few nights. Normally takes valium.  Pt states MD has been adjusting meds and isn't taking preventative meds at this. VS 134/70, 97% RA, 82 HR, CBG 95

## 2017-03-29 NOTE — Discharge Instructions (Signed)
Please be aware you may have another seizure  Do not drive until seen by your physician for your condition  Do not climb ladders/roofs/trees as a seizure can occur at that height and cause serious harm  Do not bathe/swim alone as a seizure can occur and cause serious harm  Please followup with your physician or neurologist for further testing and possible treatment  You have neck pain, possibly from a cervical strain and/or pinched nerve.   SEEK IMMEDIATE MEDICAL ATTENTION IF: You develop difficulties swallowing or breathing.  You have new or worse numbness, weakness, tingling, or movement problems in your arms or legs.  You develop increasing pain which is uncontrolled with medications.  You have change in bowel or bladder function, or other concerns.

## 2017-09-29 DIAGNOSIS — F1111 Opioid abuse, in remission: Secondary | ICD-10-CM

## 2017-09-29 DIAGNOSIS — F419 Anxiety disorder, unspecified: Secondary | ICD-10-CM

## 2017-09-29 DIAGNOSIS — F32A Depression, unspecified: Secondary | ICD-10-CM

## 2017-09-29 DIAGNOSIS — F329 Major depressive disorder, single episode, unspecified: Secondary | ICD-10-CM | POA: Insufficient documentation

## 2017-09-29 HISTORY — DX: Anxiety disorder, unspecified: F41.9

## 2017-09-29 HISTORY — DX: Opioid abuse, in remission: F11.11

## 2017-09-29 HISTORY — DX: Depression, unspecified: F32.A

## 2017-09-30 DIAGNOSIS — F445 Conversion disorder with seizures or convulsions: Secondary | ICD-10-CM | POA: Insufficient documentation

## 2017-09-30 HISTORY — DX: Conversion disorder with seizures or convulsions: F44.5

## 2018-03-03 ENCOUNTER — Emergency Department (HOSPITAL_BASED_OUTPATIENT_CLINIC_OR_DEPARTMENT_OTHER)
Admission: EM | Admit: 2018-03-03 | Discharge: 2018-03-03 | Disposition: A | Discharge status: Home / Self Care | Payer: Medicaid Other | Attending: Emergency Medicine | Admitting: Emergency Medicine

## 2018-03-03 ENCOUNTER — Emergency Department (HOSPITAL_BASED_OUTPATIENT_CLINIC_OR_DEPARTMENT_OTHER): Payer: Medicaid Other

## 2018-03-03 ENCOUNTER — Encounter (HOSPITAL_BASED_OUTPATIENT_CLINIC_OR_DEPARTMENT_OTHER): Payer: Self-pay | Admitting: *Deleted

## 2018-03-03 ENCOUNTER — Other Ambulatory Visit: Payer: Self-pay

## 2018-03-03 DIAGNOSIS — Y998 Other external cause status: Secondary | ICD-10-CM | POA: Diagnosis not present

## 2018-03-03 DIAGNOSIS — Z79899 Other long term (current) drug therapy: Secondary | ICD-10-CM | POA: Insufficient documentation

## 2018-03-03 DIAGNOSIS — Y939 Activity, unspecified: Secondary | ICD-10-CM | POA: Insufficient documentation

## 2018-03-03 DIAGNOSIS — M5410 Radiculopathy, site unspecified: Secondary | ICD-10-CM

## 2018-03-03 DIAGNOSIS — W109XXA Fall (on) (from) unspecified stairs and steps, initial encounter: Secondary | ICD-10-CM | POA: Insufficient documentation

## 2018-03-03 DIAGNOSIS — W19XXXA Unspecified fall, initial encounter: Secondary | ICD-10-CM

## 2018-03-03 DIAGNOSIS — Y929 Unspecified place or not applicable: Secondary | ICD-10-CM | POA: Insufficient documentation

## 2018-03-03 DIAGNOSIS — R569 Unspecified convulsions: Secondary | ICD-10-CM | POA: Diagnosis not present

## 2018-03-03 DIAGNOSIS — F17290 Nicotine dependence, other tobacco product, uncomplicated: Secondary | ICD-10-CM | POA: Insufficient documentation

## 2018-03-03 DIAGNOSIS — M5416 Radiculopathy, lumbar region: Secondary | ICD-10-CM | POA: Diagnosis not present

## 2018-03-03 DIAGNOSIS — M549 Dorsalgia, unspecified: Secondary | ICD-10-CM | POA: Diagnosis present

## 2018-03-03 HISTORY — DX: Other chronic pain: G89.29

## 2018-03-03 HISTORY — DX: Dorsalgia, unspecified: M54.9

## 2018-03-03 MED ORDER — PREDNISONE 10 MG (21) PO TBPK: ORAL_TABLET | Freq: Every day | ORAL | 0 refills | Status: DC

## 2018-03-03 MED ORDER — NAPROXEN 500 MG PO TABS: 500.0000 mg | ORAL_TABLET | Freq: Two times a day (BID) | ORAL | 0 refills | Status: AC

## 2018-03-03 MED FILL — NAPROXEN 500 MG TABLET: 500 | 5 days supply | Qty: 10 | Fill #0

## 2018-03-03 MED FILL — predniSONE 10 MG TABS: 10 | 12 days supply | Qty: 42 | Fill #0

## 2018-03-03 NOTE — ED Notes (Signed)
Patient transported to CT 

## 2018-03-03 NOTE — ED Triage Notes (Addendum)
Wife states pt had tooth pulled earlier today, seizure x 1 hr ago HX seizures with stress and pain , pt states he is here d/t back pain

## 2018-03-03 NOTE — ED Provider Notes (Addendum)
[facility] EMERGENCY DEPARTMENT Provider Note   CSN: [id number] Arrival date & time: 03/03/18  1340     History   Chief Complaint Chief Complaint  Patient presents with  . Back Pain    HPI Robert Nguyen is a 32 y.o. male with history of nonepileptic seizures is here for evaluation of fall earlier today.  Witnessed by wife. Patient states that he had a seizure as he was going to go down some steps and fell down approximately 13 steps.  States earlier today he got a tooth pulled at the dentist, wife states that stress, pain, panic trigger his anxiety and think that this is why he had a breakthrough seizure today.  States his seizures are happening every other day and this has been this way for several months.  He had his typical prodrome prior to seizure.  States he does not feel as "drunk" as he usually does after his seizures today.  He is complaining of neck pain and pain throughout his entire spine as well as nerve type pain shooting to his right arm, left arm, bilateral legs.  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  Whenever he has a seizure he has similar back pain and nerve type pain.  Typically he can use CBD oil and ibuprofen and after a week it improves.  States he is getting "shocked" to his right upper extremity to about the elbow level, entire left upper extremity into his fingers, right buttock and posterior thigh and left leg onto the toes.  States the pain is similar to when you hit your funny bone.  He denies any headache, vision changes, nausea, vomiting, chest pain, shortness of breath, abdominal pain, saddle anesthesia, bladder or bowel incontinence or retention, loss of sensation or weakness to his extremity.  He has been ambulatory since the fall.  Chart shows he was admitted January 2019 to [facility] for evaluation of convulsion activity, discharge paperwork states these were determined not to be  epileptic seizures and he was discharged with Valium for abortive management.  HPI  Past Medical History:  Diagnosis Date  . Chronic back pain   . Seizures (HCC)     There are no active problems to display for this patient.   Past Surgical History:  Procedure Laterality Date  . TONSILLECTOMY    . TYMPANOSTOMY TUBE PLACEMENT          Home Medications    Prior to Admission medications   Medication Sig Start Date End Date Taking? Authorizing Provider  ADDERALL XR 10 MG 24 hr capsule Take 10 mg by mouth daily. 01/31/17   [provider]  carisoprodol (SOMA) 350 MG tablet Take 350 mg by mouth 2 (two) times daily as needed. 03/21/15   [provider]  diazepam (VALIUM) 10 MG tablet Take 10 mg by mouth 2 (two) times daily as needed. 03/21/15   [provider]  naproxen (NAPROSYN) 500 MG tablet Take 1 tablet (500 mg total) by mouth 2 (two) times daily for 5 days. 03/03/18 03/08/18  Liberty Handy, PA-C  predniSONE (STERAPRED UNI-PAK 21 TAB) 10 MG (21) TBPK tablet Take by mouth daily. Take 6 tabs by mouth daily  for 2 days, then 5 tabs for 2 days, then 4 tabs for 2 days, then 3 tabs for 2 days, 2 tabs for 2 days, then 1 tab by mouth daily for 2 days 03/03/18  Sharen Heck J, PA-C  temazepam (RESTORIL) 30 MG capsule Take 30 mg by mouth at bedtime. 11/22/13   [provider]    Family History History reviewed. No pertinent family history.  Social History Social History   Tobacco Use  . Smoking status: Never Smoker  . Smokeless tobacco: Current User    Types: Snuff  Substance Use Topics  . Alcohol use: No  . Drug use: No     Allergies   Lyrica [pregabalin] and Tramadol   Review of Systems Review of Systems  Musculoskeletal: Positive for back pain, myalgias and neck pain.  Neurological:       Nerve pain to bilateral upper and lower extremities   All other systems reviewed and are negative.    Physical Exam Updated Vital Signs BP  118/80   Pulse 100   Temp 98.5 F (36.9 C)   Resp 16   Ht 6\' 2"  (1.88 m)   Wt 90.7 kg (200 lb)   SpO2 100%   BMI 25.68 kg/m   Physical Exam  Constitutional: He is oriented to person, place, and time. He appears well-developed and well-nourished. He is cooperative. He is easily aroused. No distress.  HENT:  Head: Atraumatic.  No abrasions, lacerations, deformity, defect, tenderness or crepitus of facial, nasal, scalp bones. No Raccoon's eyes. No Battle's sign. No hemotympanum or otorrhea, bilaterally. No epistaxis or rhinorrhea, septum midline.  No intraoral bleeding or injury. No malocclusion.   Eyes: Conjunctivae are normal.  Lids normal. EOMs and PERRL intact.   Neck:  C-spine: no midline or paraspinal muscular tenderness. Full active ROM of cervical spine w/o pain. Trachea midline  Cardiovascular: Normal rate, regular rhythm, S1 normal, S2 normal and normal heart sounds. Exam reveals no distant heart sounds.  Pulses:      Carotid pulses are 2+ on the right side, and 2+ on the left side.      Radial pulses are 2+ on the right side, and 2+ on the left side.       Dorsalis pedis pulses are 2+ on the right side, and 2+ on the left side.  2+ radial and DP pulses bilaterally  Pulmonary/Chest: Effort normal and breath sounds normal. He has no decreased breath sounds.  No anterior/posterior thorax tenderness. Equal and symmetric chest wall expansion   Abdominal: Soft.  Abdomen is NTND. No guarding. No seatbelt sign.   Musculoskeletal: Normal range of motion. He exhibits tenderness. He exhibits no deformity.  Full PROM of upper and lower extremities without pain  T-spine: tenderness to end of T spine midline, mild paraspinal muscular tenderness.    L-spine: midline and paraspinal tenderness.   Pelvis: no instability with AP/L compression, leg shortening or rotation. Full PROM of hips bilaterally without pain. Positive SLR bilaterally (pain radiates from buttocks to posterior R thigh  and entire posterior L leg)  Neurological: He is alert, oriented to person, place, and time and easily aroused.  Speech is fluent without obvious dysarthria or dysphasia. Strength 5/5 with hand grip and ankle F/E.   Sensation to light touch intact in hands and feet. Normal gait. No pronator drift.  Normal finger-to-nose and finger tapping.  CN I, II and VIII not tested. CN II-XII grossly intact bilaterally.   Skin: Skin is warm and dry. Capillary refill takes less than 2 seconds.  Psychiatric: His behavior is normal. Thought content normal.     ED Treatments / Results  Labs (all labs ordered are listed, but only abnormal results are  displayed) Labs Reviewed - No data to display  EKG None  Radiology Dg Thoracic Spine 2 View  Result Date: 03/03/2018 CLINICAL DATA:  Seizure. EXAM: THORACIC SPINE 2 VIEWS COMPARISON:  None. FINDINGS: Two views study of the thoracic spine limited by nonvisualization of the upper 4 thoracic vertebral bodies on lateral projections. Frontal film shows mild scoliosis. No fracture is evident. No abnormal paraspinal line. IMPRESSION: No evidence for thoracic spine fracture with limited assessment of the upper thoracic levels. If there is high clinical index of suspicion for upper thoracic spine fracture, CT imaging recommended to further evaluate. Electronically Signed   By: Kennith CenterEric  Mansell M.D.   On: 03/03/2018 16:21   Dg Lumbar Spine Complete  Result Date: 03/03/2018 CLINICAL DATA:  Seizure. EXAM: LUMBAR SPINE - COMPLETE 4+ VIEW COMPARISON:  Lumbar spine MRI 07/20/2012 FINDINGS: There is no evidence of lumbar spine fracture. Alignment is normal. Intervertebral disc spaces are maintained. IMPRESSION: Negative. Electronically Signed   By: Kennith CenterEric  Mansell M.D.   On: 03/03/2018 16:22   Ct Head Wo Contrast  Result Date: 03/03/2018 CLINICAL DATA:  Seizure and fall down stairs EXAM: CT HEAD WITHOUT CONTRAST CT CERVICAL SPINE WITHOUT CONTRAST TECHNIQUE: Multidetector CT  imaging of the head and cervical spine was performed following the standard protocol without intravenous contrast. Multiplanar CT image reconstructions of the cervical spine were also generated. COMPARISON:  Head CT 06/14/2010 FINDINGS: CT HEAD FINDINGS Brain: There is no mass, hemorrhage or extra-axial collection. The size and configuration of the ventricles and extra-axial CSF spaces are normal. There is no acute or chronic infarction. The brain parenchyma is normal. Vascular: No abnormal hyperdensity of the major intracranial arteries or dural venous sinuses. No intracranial atherosclerosis. Skull: The visualized skull base, calvarium and extracranial soft tissues are normal. Sinuses/Orbits: No fluid levels or advanced mucosal thickening of the visualized paranasal sinuses. No mastoid or middle ear effusion. The orbits are normal. CT CERVICAL SPINE FINDINGS Alignment: No static subluxation. Facets are aligned. Occipital condyles are normally positioned. Skull base and vertebrae: No acute fracture. Soft tissues and spinal canal: No prevertebral fluid or swelling. No visible canal hematoma. Disc levels: No advanced spinal canal or neural foraminal stenosis. Upper chest: No pneumothorax, pulmonary nodule or pleural effusion. Other: Normal visualized paraspinal cervical soft tissues. IMPRESSION: Normal CT of the head and cervical spine. Electronically Signed   By: Deatra RobinsonKevin  Herman M.D.   On: 03/03/2018 15:49   Ct Cervical Spine Wo Contrast  Result Date: 03/03/2018 CLINICAL DATA:  Seizure and fall down stairs EXAM: CT HEAD WITHOUT CONTRAST CT CERVICAL SPINE WITHOUT CONTRAST TECHNIQUE: Multidetector CT imaging of the head and cervical spine was performed following the standard protocol without intravenous contrast. Multiplanar CT image reconstructions of the cervical spine were also generated. COMPARISON:  Head CT 06/14/2010 FINDINGS: CT HEAD FINDINGS Brain: There is no mass, hemorrhage or extra-axial collection. The  size and configuration of the ventricles and extra-axial CSF spaces are normal. There is no acute or chronic infarction. The brain parenchyma is normal. Vascular: No abnormal hyperdensity of the major intracranial arteries or dural venous sinuses. No intracranial atherosclerosis. Skull: The visualized skull base, calvarium and extracranial soft tissues are normal. Sinuses/Orbits: No fluid levels or advanced mucosal thickening of the visualized paranasal sinuses. No mastoid or middle ear effusion. The orbits are normal. CT CERVICAL SPINE FINDINGS Alignment: No static subluxation. Facets are aligned. Occipital condyles are normally positioned. Skull base and vertebrae: No acute fracture. Soft tissues and spinal canal: No prevertebral fluid  or swelling. No visible canal hematoma. Disc levels: No advanced spinal canal or neural foraminal stenosis. Upper chest: No pneumothorax, pulmonary nodule or pleural effusion. Other: Normal visualized paraspinal cervical soft tissues. IMPRESSION: Normal CT of the head and cervical spine. Electronically Signed   By: Deatra Robinson M.D.   On: 03/03/2018 15:49    Procedures Procedures (including critical care time)  Medications Ordered in ED Medications - No data to display   Initial Impression / Assessment and Plan / ED Course  I have reviewed the triage vital signs and the nursing notes.  Pertinent labs & imaging results that were available during my care of the patient were reviewed by me and considered in my medical decision making (see chart for details).    32 year old here after fall status post seizure.  Fall witnessed by wife. Exam as above remarkable for a low and T-spine and lumbar spine midline tenderness, positive SLR bilaterally.  Otherwise no signs of significant head, neck, chest, abdominal, pelvis or extremity trauma.  Given mechanism of injury, imaging was obtained and was negative.  In regards to his seizures, he has long history of these since age of  85.  No recent changes to seizure patterns, prodromal or postictal state.  He has seizures every other day. Reviewed chart and patient was admitted to [facility] January 2019 for further characterization of his seizures, discharged with diagnosis of nonepileptic seizure.  Neurology did not recommend antiepileptic medications. On valium as needed for abortive tx which he has never used. Wife states that they were told stress, pain, panic can exacerbate seizure activity.  Episode today occurred after patient had a dental extraction and wife thinks that this may have been why he had a seizure today.  He is having seizures every other day and this is unchanged.  No h/o syncope, ETOH abuse or withdrawal. No prodrome of CP, SOB, severe HA.  Given recent work up for non epileptic seizures, reassuring neuro exam today I do not think further emergent lab work or monitoring is indicated today. Will dc with tx for MSK soreness vs radiculopathy (+SLR bilaterally).  Discussed plan with pt and wife and they are in agreement. Discussed return precautions.  Final Clinical Impressions(s) / ED Diagnoses   Final diagnoses:  Fall, initial encounter  Radicular pain    ED Discharge Orders        Ordered    predniSONE (STERAPRED UNI-PAK 21 TAB) 10 MG (21) TBPK tablet  Daily     03/03/18 1650    naproxen (NAPROSYN) 500 MG tablet  2 times daily     03/03/18 1650       Liberty Handy, [place] 03/03/18 1659    Tilden Fossa, MD 03/04/18 [phone]

## 2018-03-03 NOTE — ED Notes (Signed)
Patient transported to X-ray 

## 2018-03-03 NOTE — Discharge Instructions (Signed)
You were seen in the ER after a fall.  Imaging today was normal without any new injuries.  I suspect the shock like type pain that you are having to your arms and legs are from nerve injury that you have had in the past.  Take 500 to 1000 mg of acetaminophen every 6-8 hours for pain, additionally you can take naproxen. Prednisone taper can help with nerve related pain (radicular pain)   Follow up with your primary care doctor in 1 week if symptoms not improving  Return to the ER if your symptoms worsen, if there is groin numbness, bladder or bowel incontinence or retention, loss of sensation or weakness to your extremities

## 2018-04-21 DIAGNOSIS — G894 Chronic pain syndrome: Secondary | ICD-10-CM | POA: Insufficient documentation

## 2018-06-02 ENCOUNTER — Ambulatory Visit: Payer: Medicaid Other | Admitting: Cardiology

## 2018-06-02 ENCOUNTER — Encounter: Payer: Self-pay | Admitting: Cardiology

## 2018-06-02 DIAGNOSIS — Z8249 Family history of ischemic heart disease and other diseases of the circulatory system: Secondary | ICD-10-CM

## 2018-06-02 DIAGNOSIS — F17200 Nicotine dependence, unspecified, uncomplicated: Secondary | ICD-10-CM | POA: Insufficient documentation

## 2018-06-02 DIAGNOSIS — F319 Bipolar disorder, unspecified: Secondary | ICD-10-CM

## 2018-06-02 DIAGNOSIS — R030 Elevated blood-pressure reading, without diagnosis of hypertension: Secondary | ICD-10-CM | POA: Insufficient documentation

## 2018-06-02 DIAGNOSIS — M51379 Other intervertebral disc degeneration, lumbosacral region without mention of lumbar back pain or lower extremity pain: Secondary | ICD-10-CM | POA: Insufficient documentation

## 2018-06-02 DIAGNOSIS — F988 Other specified behavioral and emotional disorders with onset usually occurring in childhood and adolescence: Secondary | ICD-10-CM

## 2018-06-02 DIAGNOSIS — M5137 Other intervertebral disc degeneration, lumbosacral region: Secondary | ICD-10-CM

## 2018-06-02 HISTORY — DX: Family history of ischemic heart disease and other diseases of the circulatory system: Z82.49

## 2018-06-02 HISTORY — DX: Bipolar disorder, unspecified: F31.9

## 2018-06-02 HISTORY — DX: Other specified behavioral and emotional disorders with onset usually occurring in childhood and adolescence: F98.8

## 2018-06-02 HISTORY — DX: Other intervertebral disc degeneration, lumbosacral region: M51.37

## 2018-06-02 NOTE — Progress Notes (Deleted)
Cardiology Office Note:    Date:  06/02/2018   ID:  Robert Nguyen, DOB [birth year], MRN [id number]  PCP:  System, Pcp Not In  Cardiologist:  Norman Herrlich, MD   Referring MD: Ronal Fear, NP  ASSESSMENT:    1. Family history of premature CAD    PLAN:    In order of problems listed above:  1. ***  Next appointment   Medication Adjustments/Labs and Tests Ordered: Current medicines are reviewed at length with the patient today.  Concerns regarding medicines are outlined above.  No orders of the defined types were placed in this encounter.  No orders of the defined types were placed in this encounter.    No chief complaint on file. ***  History of Present Illness:    Robert Nguyen is a 32 y.o. male who is being seen today for the evaluation of chest pain at the request of Lam, Tawny Asal, NP.   Past Medical History:  Diagnosis Date  . Abdominal pain 10/22/2011  . 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 Adjustment disorder with anxiety 04/20/2004   Overview:  IMPRESSION: He has certainly had an adequate workup and so I really think it is just vaso vagal and that he has an unconscious anxiety disorder.  Will give a trial of paxil.  Marland Kitchen Anxiety and depression 09/29/2017  . Bipolar 1 disorder (HCC) 11/05/2009  . Bipolar depression (HCC) 06/02/2018  . Chronic back pain   . DDD (degenerative disc disease), lumbosacral 06/02/2018  . Family history of early CAD 06/02/2018  . Headache 04/20/2004  . Hematuria 11/24/2012  . History of opioid abuse (HCC) 09/29/2017  . History of tonsillectomy 06/02/2004  . Hypercholesterolemia 06/02/2004   Overview:  IMPRESSION: Given her fathers history will treat.  . Major depressive disorder, recurrent episode (HCC) 02/05/2013  . Psychogenic nonepileptic seizure 09/30/2017  . Right upper quadrant pain 12/09/2012  . Seizures (HCC)   . Sleeping difficulty 04/21/2008  . Transient alteration of awareness 05/01/2014  . Tremor,  unspecified 10/14/2016   Overview:  ---Nov 2014-CT---No acute intracranial abnormality.     Past Surgical History:  Procedure Laterality Date  . TONSILLECTOMY    . TYMPANOSTOMY TUBE PLACEMENT      Current Medications: No outpatient medications have been marked as taking for the 06/02/18 encounter (Appointment) with Baldo Daub, MD.     Allergies:   Gabapentin; Pregabalin; Tramadol; Carbamazepine; and Naproxen   Social History   Socioeconomic History  . Marital status: Married    Spouse name: Not on file  . Number of children: Not on file  . Years of education: Not on file  . Highest education level: Not on file  Occupational History  . Not on file  Social Needs  . Financial resource strain: Not on file  . Food insecurity:    Worry: Not on file    Inability: Not on file  . Transportation needs:    Medical: Not on file    Non-medical: Not on file  Tobacco Use  . Smoking status: Never Smoker  . Smokeless tobacco: Current User    Types: Snuff  Substance and Sexual Activity  . Alcohol use: No  . Drug use: No  . Sexual activity: Not on file  Lifestyle  . Physical activity:    Days per week: Not on file    Minutes per session: Not on file  . Stress: Not on file  Relationships  . Social connections:    Talks  on phone: Not on file    Gets together: Not on file    Attends religious service: Not on file    Active member of club or organization: Not on file    Attends meetings of clubs or organizations: Not on file    Relationship status: Not on file  Other Topics Concern  . Not on file  Social History Narrative  . Not on file     Family History: The patient's ***family history includes Asthma in his sister; Bipolar disorder in his father; Diabetes in his maternal grandfather; Heart attack in his father, maternal grandfather, and paternal grandfather; Heart disease in his maternal grandfather, paternal grandfather, and paternal grandmother; Hyperlipidemia in his  mother; Hypertension in his mother; Lung cancer in his maternal grandfather.  ROS:   ROS Please see the history of present illness.    *** All other systems reviewed and are negative.  EKGs/Labs/Other Studies Reviewed:    The following studies were reviewed today: ***  EKG:  EKG is *** ordered today.  The ekg ordered today demonstrates *** EKG 03/29/17 SRTH and normal Recent Labs: No results found for requested labs within last 8760 hours.  Recent Lipid Panel 09/29/17 CMP normal CBC normal but MCV 79 No results found for: CHOL, TRIG, HDL, CHOLHDL, VLDL, LDLCALC, LDLDIRECT  Physical Exam:    VS:  There were no vitals taken for this visit.    Wt Readings from Last 3 Encounters:  03/03/18 200 lb (90.7 kg)     GEN: *** Well nourished, well developed in no acute distress HEENT: Normal NECK: No JVD; No carotid bruits LYMPHATICS: No lymphadenopathy CARDIAC: ***RRR, no murmurs, rubs, gallops RESPIRATORY:  Clear to auscultation without rales, wheezing or rhonchi  ABDOMEN: Soft, non-tender, non-distended MUSCULOSKELETAL:  No edema; No deformity  SKIN: Warm and dry NEUROLOGIC:  Alert and oriented x 3 PSYCHIATRIC:  Normal affect     Signed, Norman Herrlich, MD  06/02/2018 8:23 AM    [facility]

## 2018-07-12 NOTE — Progress Notes (Signed)
Cardiology Office Note:    Date:  07/13/2018   ID:  Robert Nguyen, DOB [birth year], MRN [id number]  PCP:  Ronal Fear, NP  Cardiologist:  Norman Herrlich, MD   Referring MD: Ronal Fear, NP  ASSESSMENT:    1. Family history of early CAD   2. Hypercholesterolemia   3. Diabetes mellitus screening    PLAN:    In order of problems listed above:  1. His identifiable cardiac risk factors are unremarkable except for the family history of his father.  For further evaluation I asked to do conventional lipid profile LP(a) and hemoglobin A1c.  At this time I do not think he needs an ischemia evaluation.  With his history of respiratory arrest 2012 echocardiogram with a normal surface EKG I doubt that he has cardiomyopathy.  I think this information will give reassurance to the patient and allow [place] to better calculate his risk and appropriateness of statin therapy. 2. Check baseline lipids LP(a) 3. Check hemoglobin A1c  Next appointment 6 to 8 weeks   Medication Adjustments/Labs and Tests Ordered: Current medicines are reviewed at length with the patient today.  Concerns regarding medicines are outlined above.  Orders Placed This Encounter  Procedures  . Lipid Profile  . Lipoprotein A (LPA)  . HgB A1c  . EKG 12-Lead  . ECHOCARDIOGRAM COMPLETE   No orders of the defined types were placed in this encounter.    Chief Complaint  Patient presents with  . Coronary Artery Disease    family history    History of Present Illness:    Robert Nguyen is a 32 y.o. male with a history of bipolar disorder anxiety depression ADHD gastritis tremor history of opioid abuse and restless leg syndrome who is being seen today for the evaluation of CAD at the request of Lam, Tawny Asal, NP.  Echo is seen in the office today along with his wife and son.  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  He had a history of cervical and lumbosacral disc disease and chronic pain.  His wife tells me in 2012  [facility] he had a respiratory arrest and seizures had CPR and may or may not have been defibrillated from her description she thinks he had cardiac testing I cannot locate anything in the chart EHR was never told he had heart disease and was not advised to defibrillator.  With his seizures he gets pain in left shoulder and left chest but does not have exertional angina of breath palpitation or syncope.  He has no known congenital rheumatic heart disease.  His concern is the family history father MI died age 71 paternal grandfather coronary artery disease died 13 paternal grandfather coronary artery disease died in his 86s and a mother who has a lipid disorder.  Patient is wife are unaware if he is been screened with an A1c or lipid profile.  He smoked years ago.  He request further evaluation to gauge his risk of heart disease and whether he had any cardiac injury to his respiratory arrest in 2012 Past Medical History:  Diagnosis Date  . Abdominal pain 10/22/2011  . 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 Adjustment disorder with anxiety 04/20/2004   Overview:  IMPRESSION: He has certainly had an adequate workup and so I really think it is just vaso vagal and that he has an unconscious anxiety disorder.  Will give a trial of paxil.  Marland Kitchen Anxiety and depression 09/29/2017  .  Bipolar 1 disorder (HCC) 11/05/2009  . Bipolar depression (HCC) 06/02/2018  . Chronic back pain   . DDD (degenerative disc disease), lumbosacral 06/02/2018  . Family history of early CAD 06/02/2018  . Headache 04/20/2004  . Hematuria 11/24/2012  . History of opioid abuse (HCC) 09/29/2017  . History of tonsillectomy 06/02/2004  . Hypercholesterolemia 06/02/2004   Overview:  IMPRESSION: Given her fathers history will treat.  . Major depressive disorder, recurrent episode (HCC) 02/05/2013  . Psychogenic nonepileptic seizure 09/30/2017  . Right upper quadrant pain 12/09/2012  . Seizures (HCC)   .  Sleeping difficulty 04/21/2008  . Transient alteration of awareness 05/01/2014  . Tremor, unspecified 10/14/2016   Overview:  ---Nov 2014-CT---No acute intracranial abnormality.     Past Surgical History:  Procedure Laterality Date  . TONSILLECTOMY    . TYMPANOSTOMY TUBE PLACEMENT      Current Medications: Current Meds  Medication Sig  . ADDERALL XR 20 MG 24 hr capsule Take 10 mg by mouth 2 (two) times daily as needed.   . carisoprodol (SOMA) 350 MG tablet Take 350 mg by mouth 2 (two) times daily as needed.  . diazepam (VALIUM) 10 MG tablet Take 10 mg by mouth 2 (two) times daily as needed.  . temazepam (RESTORIL) 30 MG capsule Take 30 mg by mouth at bedtime.     Allergies:   Gabapentin; Pregabalin; Tramadol; Carbamazepine; and Naproxen   Social History   Socioeconomic History  . Marital status: Married    Spouse name: Not on file  . Number of children: Not on file  . Years of education: Not on file  . Highest education level: Not on file  Occupational History  . Not on file  Social Needs  . Financial resource strain: Not on file  . Food insecurity:    Worry: Not on file    Inability: Not on file  . Transportation needs:    Medical: Not on file    Non-medical: Not on file  Tobacco Use  . Smoking status: Former Smoker    Packs/day: 1.00    Years: 2.00    Pack years: 2.00    Types: Cigarettes    Last attempt to quit: 2015    Years since quitting: 4.8  . Smokeless tobacco: Current User    Types: Snuff  Substance and Sexual Activity  . Alcohol use: No  . Drug use: No  . Sexual activity: Not on file  Lifestyle  . Physical activity:    Days per week: Not on file    Minutes per session: Not on file  . Stress: Not on file  Relationships  . Social connections:    Talks on phone: Not on file    Gets together: Not on file    Attends religious service: Not on file    Active member of club or organization: Not on file    Attends meetings of clubs or organizations:  Not on file    Relationship status: Not on file  Other Topics Concern  . Not on file  Social History Narrative  . Not on file     Family History: The patient's family history includes Asthma in his sister; Bipolar disorder in his father; Diabetes in his maternal grandfather; Heart attack in his father, maternal grandfather, and paternal grandfather; Heart disease in his maternal grandfather, paternal grandfather, and paternal grandmother; Hyperlipidemia in his mother; Hypertension in his mother; Lung cancer in his maternal grandfather.  ROS:   Review of Systems  Constitution: Positive for malaise/fatigue.  HENT: Negative.   Eyes: Negative.   Cardiovascular: Positive for chest pain.  Respiratory: Negative.   Endocrine: Negative.   Hematologic/Lymphatic: Negative.   Skin: Negative.   Musculoskeletal: Positive for back pain, joint pain and neck pain.  Gastrointestinal: Negative.   Genitourinary: Negative.   Neurological: Positive for seizures.  Psychiatric/Behavioral: Negative.   Allergic/Immunologic: Negative.    Please see the history of present illness.     All other systems reviewed and are negative.  EKGs/Labs/Other Studies Reviewed:    The following studies were reviewed today:   EKG:  EKG is  ordered today.  The ekg ordered today demonstrates normal  Recent Labs: No results found for requested labs within last 8760 hours.  Recent Lipid Panel No results found for: CHOL, TRIG, HDL, CHOLHDL, VLDL, LDLCALC, LDLDIRECT  Physical Exam:    VS:  BP 96/76 (BP Location: Left Arm, Patient Position: Sitting, Cuff Size: Normal)   Pulse 92   Ht 6\' 2"  (1.88 m)   Wt 209 lb 6.4 oz (95 kg)   SpO2 98%   BMI 26.89 kg/m     Wt Readings from Last 3 Encounters:  07/13/18 209 lb 6.4 oz (95 kg)  03/03/18 200 lb (90.7 kg)     GEN:  Well nourished, well developed in no acute distress HEENT: Normal NECK: No JVD; No carotid bruits LYMPHATICS: No lymphadenopathy CARDIAC: RRR, no  murmurs, rubs, gallops RESPIRATORY:  Clear to auscultation without rales, wheezing or rhonchi  ABDOMEN: Soft, non-tender, non-distended MUSCULOSKELETAL:  No edema; No deformity  SKIN: Warm and dry NEUROLOGIC:  Alert and oriented x 3 PSYCHIATRIC:  Normal affect     Signed, Norman Herrlich, MD  07/13/2018 5:24 PM     [facility]

## 2018-07-13 ENCOUNTER — Encounter: Payer: Self-pay | Admitting: Cardiology

## 2018-07-13 ENCOUNTER — Ambulatory Visit (INDEPENDENT_AMBULATORY_CARE_PROVIDER_SITE_OTHER): Payer: Medicaid Other | Admitting: Cardiology

## 2018-07-13 VITALS — BP 96/76 | HR 92 | Ht 74.0 in | Wt 209.4 lb

## 2018-07-13 DIAGNOSIS — E7800 Pure hypercholesterolemia, unspecified: Secondary | ICD-10-CM

## 2018-07-13 DIAGNOSIS — Z131 Encounter for screening for diabetes mellitus: Secondary | ICD-10-CM

## 2018-07-13 DIAGNOSIS — Z8249 Family history of ischemic heart disease and other diseases of the circulatory system: Secondary | ICD-10-CM | POA: Diagnosis not present

## 2018-07-13 NOTE — Patient Instructions (Signed)
Medication Instructions:  Your physician recommends that you continue on your current medications as directed. Please refer to the Current Medication list given to you today.  If you need a refill on your cardiac medications before your next appointment, please call your pharmacy.   Lab work: Your physician recommends that you return for lab work today: lipid panel, lipoprotein A (Lpa), hemoglobin A1c.   If you have labs (blood work) drawn today and your tests are completely normal, you will receive your results only by: Robert Nguyen. MyChart Message (if you have MyChart) OR . A paper copy in the mail If you have any lab test that is abnormal or we need to change your treatment, we will call you to review the results.  Testing/Procedures: You had an EKG today.   Your physician has requested that you have an echocardiogram. Echocardiography is a painless test that uses sound waves to create images of your heart. It provides your doctor with information about the size and shape of your heart and how well your heart's chambers and valves are working. This procedure takes approximately one hour. There are no restrictions for this procedure.  Follow-Up: At [facility], you and your health needs are our priority.  As part of our continuing mission to provide you with exceptional heart care, we have created designated Provider Care Teams.  These Care Teams include your primary Cardiologist (physician) and Advanced Practice Providers (APPs -  Physician Assistants and Nurse Practitioners) who all work together to provide you with the care you need, when you need it. You will need a follow up appointment in 6 weeks.      Echocardiogram An echocardiogram, or echocardiography, uses sound waves (ultrasound) to produce an image of your heart. The echocardiogram is simple, painless, obtained within a short period of time, and offers valuable information to your health care provider. The images from an  echocardiogram can provide information such as:  Evidence of coronary artery disease (CAD).  Heart size.  Heart muscle function.  Heart valve function.  Aneurysm detection.  Evidence of a past heart attack.  Fluid buildup around the heart.  Heart muscle thickening.  Assess heart valve function.  Tell a health care provider about:  Any allergies you have.  All medicines you are taking, including vitamins, herbs, eye drops, creams, and over-the-counter medicines.  Any problems you or family members have had with anesthetic medicines.  Any blood disorders you have.  Any surgeries you have had.  Any medical conditions you have.  Whether you are pregnant or may be pregnant. What happens before the procedure? No special preparation is needed. Eat and drink normally. What happens during the procedure?  In order to produce an image of your heart, gel will be applied to your chest and a wand-like tool (transducer) will be moved over your chest. The gel will help transmit the sound waves from the transducer. The sound waves will harmlessly bounce off your heart to allow the heart images to be captured in real-time motion. These images will then be recorded.  You may need an IV to receive a medicine that improves the quality of the pictures. What happens after the procedure? You may return to your normal schedule including diet, activities, and medicines, unless your health care provider tells you otherwise. This information is not intended to replace advice given to you by your health care provider. Make sure you discuss any questions you have with your health care provider. Document Released: 08/15/2000 Document Revised:  04/05/2016 Document Reviewed: 04/25/2013 Elsevier Interactive Patient Education  2017 [employer].

## 2018-07-14 LAB — LIPID PANEL
CHOL/HDL RATIO: 5.2 ratio — AB (ref 0.0–5.0)
Cholesterol, Total: 258 mg/dL — ABNORMAL HIGH (ref 100–199)
HDL: 50 mg/dL (ref >39)
LDL CALC: 189 mg/dL — AB (ref 0–99)
TRIGLYCERIDES: 93 mg/dL (ref 0–149)
VLDL Cholesterol Cal: 19 mg/dL (ref 5–40)

## 2018-07-14 LAB — LIPOPROTEIN A (LPA): LIPOPROTEIN (A): 27.9 nmol/L (ref <75.0)

## 2018-07-14 LAB — HEMOGLOBIN A1C
ESTIMATED AVERAGE GLUCOSE: 103 mg/dL
Hgb A1c MFr Bld: 5.2 % (ref 4.8–5.6)

## 2018-07-15 ENCOUNTER — Telehealth: Payer: Self-pay | Admitting: *Deleted

## 2018-07-15 DIAGNOSIS — E7800 Pure hypercholesterolemia, unspecified: Secondary | ICD-10-CM

## 2018-07-15 MED ORDER — PRAVASTATIN SODIUM 40 MG PO TABS: 40.0000 mg | ORAL_TABLET | Freq: Every evening | ORAL | 3 refills | Status: DC

## 2018-07-15 NOTE — Telephone Encounter (Signed)
Patient's wife, Robert Nguyen, per DPR informed of lab results and advised to start pravastatin 40 mg daily. Desiree agreeable, prescription sent to [facility]. Informed her that patient should return to our office in 1 month for repeat lab work, no appointment needed. Reminded her to have him fast beforehand. Desiree verbalized understanding. No further questions.

## 2018-07-15 NOTE — Telephone Encounter (Signed)
-----   Message from Baldo DaubBrian J Munley, MD sent at 07/14/2018  1:14 PM EST ----- Elevated lipids, I would advise a statin, pravastatin 40 mg day with evening meal 3 30 refill 3, recheck lipids 1 month and CMP

## 2018-08-30 ENCOUNTER — Other Ambulatory Visit: Payer: Medicaid Other

## 2018-09-06 ENCOUNTER — Ambulatory Visit: Payer: Medicaid Other | Admitting: Cardiology

## 2018-10-03 NOTE — Progress Notes (Signed)
Cardiology Office Note:    Date:  10/04/2018   ID:  Robert Nguyen, DOB [birth year], MRN [id number]  PCP:  Ronal Fear, NP  Cardiologist:  Norman Herrlich, MD    Referring MD: Ronal Fear, NP    ASSESSMENT:    1. Hypercholesterolemia   2. Family history of early CAD    PLAN:    In order of problems listed above:  1. After discussion of options he prefers to delay statin therapy for 2 months concentrate on lifestyle modification and will recheck his lipid profile if he still has severe hyperlipidemia at that time he will initiate statin therapy. 2. He decided not to have cardiac imaging performed his EKG is normal I think it is a reasonable decision   Next appointment: 3 months after recheck of lipids 2 months   Medication Adjustments/Labs and Tests Ordered: Current medicines are reviewed at length with the patient today.  Concerns regarding medicines are outlined above.  Orders Placed This Encounter  Procedures  . Lipid Profile   No orders of the defined types were placed in this encounter.   Chief Complaint  Patient presents with  . Follow-up  . Hyperlipidemia    History of Present Illness:    Robert Nguyen is a 33 y.o. male with a hx of bipolar disorder pseudoseizure anxiety depression ADHD gastritis tremor history of opioid abuse and restless leg syndrome  last seen 07/13/18 re cardiac risk with a FH of premature CAD.  He had severe dyslipidemia with an LDL of 189 his lipoprotein a level was normal echocardiogram was ordered but has yet to be completed and his electrocardiogram was normal.  He is initiated on lipid-lowering therapy with pravastatin particularly to avoid side effects and drug interaction. Compliance with diet, lifestyle and medications: Yes  He decided to forego a statin wants to concentrate on lifestyle modification and will recheck a lipid profile in 2 months and make a decision I told him I suspect he will still have severe hyperlipidemia and he is  agreed to start a statin at that point.  No chest pain dyspnea palpitation or syncope.  He decided not to have cardiac imaging performed and I think is a reasonable choice Past Medical History:  Diagnosis Date  . Abdominal pain 10/22/2011  . 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 Adjustment disorder with anxiety 04/20/2004   Overview:  IMPRESSION: He has certainly had an adequate workup and so I really think it is just vaso vagal and that he has an unconscious anxiety disorder.  Will give a trial of paxil.  Marland Kitchen Anxiety and depression 09/29/2017  . Bipolar 1 disorder (HCC) 11/05/2009  . Bipolar depression (HCC) 06/02/2018  . Chronic back pain   . Concussion   . DDD (degenerative disc disease), lumbosacral 06/02/2018  . Family history of early CAD 06/02/2018  . Headache 04/20/2004  . Hematuria 11/24/2012  . History of opioid abuse (HCC) 09/29/2017  . History of tonsillectomy 06/02/2004  . Hypercholesterolemia 06/02/2004   Overview:  IMPRESSION: Given her fathers history will treat.  . Major depressive disorder, recurrent episode (HCC) 02/05/2013  . Psychogenic nonepileptic seizure 09/30/2017  . Right upper quadrant pain 12/09/2012  . Seizures (HCC)   . Sleeping difficulty 04/21/2008  . Transient alteration of awareness 05/01/2014  . Tremor, unspecified 10/14/2016   Overview:  ---Nov 2014-CT---No acute intracranial abnormality.     Past Surgical History:  Procedure Laterality Date  . TONSILLECTOMY    . TYMPANOSTOMY TUBE  PLACEMENT      Current Medications: Current Meds  Medication Sig  . amphetamine-dextroamphetamine (ADDERALL) 20 MG tablet Take 20 mg by mouth 2 (two) times daily as needed.  . carisoprodol (SOMA) 350 MG tablet Take 350 mg by mouth 2 (two) times daily as needed.  . diazepam (VALIUM) 10 MG tablet Take 10 mg by mouth 2 (two) times daily as needed.  . temazepam (RESTORIL) 30 MG capsule Take 30 mg by mouth at bedtime.     Allergies:   Gabapentin;  Pregabalin; Tramadol; Carbamazepine; and Naproxen   Social History   Socioeconomic History  . Marital status: Married    Spouse name: Not on file  . Number of children: Not on file  . Years of education: Not on file  . Highest education level: Not on file  Occupational History  . Not on file  Social Needs  . Financial resource strain: Not on file  . Food insecurity:    Worry: Not on file    Inability: Not on file  . Transportation needs:    Medical: Not on file    Non-medical: Not on file  Tobacco Use  . Smoking status: Former Smoker    Packs/day: 1.00    Years: 2.00    Pack years: 2.00    Types: Cigarettes    Last attempt to quit: 2015    Years since quitting: 5.0  . Smokeless tobacco: Current User    Types: Snuff  Substance and Sexual Activity  . Alcohol use: No  . Drug use: No  . Sexual activity: Not on file  Lifestyle  . Physical activity:    Days per week: Not on file    Minutes per session: Not on file  . Stress: Not on file  Relationships  . Social connections:    Talks on phone: Not on file    Gets together: Not on file    Attends religious service: Not on file    Active member of club or organization: Not on file    Attends meetings of clubs or organizations: Not on file    Relationship status: Not on file  Other Topics Concern  . Not on file  Social History Narrative  . Not on file     Family History: The patient's family history includes Asthma in his sister; Bipolar disorder in his father; Diabetes in his maternal grandfather; Heart attack in his father, maternal grandfather, and paternal grandfather; Heart disease in his maternal grandfather, paternal grandfather, and paternal grandmother; Hyperlipidemia in his mother; Hypertension in his mother; Lung cancer in his maternal grandfather. ROS:   Please see the history of present illness.    All other systems reviewed and are negative.  EKGs/Labs/Other Studies Reviewed:    The following studies  were reviewed today:  Recent Labs: No results found for requested labs within last 8760 hours.  Recent Lipid Panel    Component Value Date/Time   CHOL 258 (H) 07/13/2018 1619   TRIG 93 07/13/2018 1619   HDL 50 07/13/2018 1619   CHOLHDL 5.2 (H) 07/13/2018 1619   LDLCALC 189 (H) 07/13/2018 1619    Physical Exam:    VS:  BP 130/90 (BP Location: Right Arm, Patient Position: Sitting, Cuff Size: Normal)   Pulse (!) 109   Ht 6\' 2"  (1.88 m)   Wt 225 lb 6 oz (102.2 kg)   SpO2 99%   BMI 28.94 kg/m     Wt Readings from Last 3 Encounters:  10/04/18 225  lb 6 oz (102.2 kg)  07/13/18 209 lb 6.4 oz (95 kg)  03/03/18 200 lb (90.7 kg)     GEN:  Well nourished, well developed in no acute distress HEENT: Normal NECK: No JVD; No carotid bruits LYMPHATICS: No lymphadenopathy CARDIAC: RRR, no murmurs, rubs, gallops RESPIRATORY:  Clear to auscultation without rales, wheezing or rhonchi  ABDOMEN: Soft, non-tender, non-distended MUSCULOSKELETAL:  No edema; No deformity  SKIN: Warm and dry NEUROLOGIC:  Alert and oriented x 3 PSYCHIATRIC:  Normal affect    Signed, Norman HerrlichBrian Munley, MD  10/04/2018 5:19 PM    [facility]

## 2018-10-04 ENCOUNTER — Encounter: Payer: Self-pay | Admitting: Cardiology

## 2018-10-04 ENCOUNTER — Ambulatory Visit (INDEPENDENT_AMBULATORY_CARE_PROVIDER_SITE_OTHER): Payer: Medicaid Other | Admitting: Cardiology

## 2018-10-04 VITALS — BP 130/90 | HR 109 | Ht 74.0 in | Wt 225.4 lb

## 2018-10-04 DIAGNOSIS — E7800 Pure hypercholesterolemia, unspecified: Secondary | ICD-10-CM | POA: Diagnosis not present

## 2018-10-04 DIAGNOSIS — Z8249 Family history of ischemic heart disease and other diseases of the circulatory system: Secondary | ICD-10-CM

## 2018-10-04 NOTE — Patient Instructions (Signed)
Medication Instructions:  Your physician recommends that you continue on your current medications as directed. Please refer to the Current Medication list given to you today.  If you need a refill on your cardiac medications before your next appointment, please call your pharmacy.   Lab work: You will return in 2 months for fasting lab work.   If you have labs (blood work) drawn today and your tests are completely normal, you will receive your results only by: Marland Kitchen MyChart Message (if you have MyChart) OR . A paper copy in the mail If you have any lab test that is abnormal or we need to change your treatment, we will call you to review the results.  Testing/Procedures: NONE  Follow-Up: At [facility], you and your health needs are our priority.  As part of our continuing mission to provide you with exceptional heart care, we have created designated Provider Care Teams.  These Care Teams include your primary Cardiologist (physician) and Advanced Practice Providers (APPs -  Physician Assistants and Nurse Practitioners) who all work together to provide you with the care you need, when you need it. You will need a follow up appointment in 3 months.  Please call our office 2 months in advance to schedule this appointment.

## 2020-03-01 DIAGNOSIS — Z419 Encounter for procedure for purposes other than remedying health state, unspecified: Secondary | ICD-10-CM | POA: Diagnosis not present

## 2020-04-01 DIAGNOSIS — Z419 Encounter for procedure for purposes other than remedying health state, unspecified: Secondary | ICD-10-CM | POA: Diagnosis not present

## 2020-04-05 DIAGNOSIS — R569 Unspecified convulsions: Secondary | ICD-10-CM | POA: Diagnosis not present

## 2020-04-05 DIAGNOSIS — S199XXA Unspecified injury of neck, initial encounter: Secondary | ICD-10-CM | POA: Diagnosis not present

## 2020-04-05 DIAGNOSIS — S0990XA Unspecified injury of head, initial encounter: Secondary | ICD-10-CM | POA: Diagnosis not present

## 2020-04-05 DIAGNOSIS — M542 Cervicalgia: Secondary | ICD-10-CM | POA: Diagnosis not present

## 2020-05-02 DIAGNOSIS — Z419 Encounter for procedure for purposes other than remedying health state, unspecified: Secondary | ICD-10-CM | POA: Diagnosis not present

## 2020-05-22 DIAGNOSIS — R000 Tachycardia, unspecified: Secondary | ICD-10-CM | POA: Diagnosis not present

## 2020-05-22 DIAGNOSIS — F319 Bipolar disorder, unspecified: Secondary | ICD-10-CM | POA: Diagnosis not present

## 2020-05-22 DIAGNOSIS — F514 Sleep terrors [night terrors]: Secondary | ICD-10-CM | POA: Diagnosis not present

## 2020-05-22 DIAGNOSIS — F445 Conversion disorder with seizures or convulsions: Secondary | ICD-10-CM | POA: Diagnosis not present

## 2020-05-22 DIAGNOSIS — Z6838 Body mass index (BMI) 38.0-38.9, adult: Secondary | ICD-10-CM | POA: Diagnosis not present

## 2020-05-22 DIAGNOSIS — M5440 Lumbago with sciatica, unspecified side: Secondary | ICD-10-CM | POA: Diagnosis not present

## 2020-05-22 DIAGNOSIS — F988 Other specified behavioral and emotional disorders with onset usually occurring in childhood and adolescence: Secondary | ICD-10-CM | POA: Diagnosis not present

## 2020-11-23 DIAGNOSIS — Z6835 Body mass index (BMI) 35.0-35.9, adult: Secondary | ICD-10-CM | POA: Diagnosis not present

## 2020-11-23 DIAGNOSIS — M5440 Lumbago with sciatica, unspecified side: Secondary | ICD-10-CM | POA: Diagnosis not present

## 2020-11-23 DIAGNOSIS — F319 Bipolar disorder, unspecified: Secondary | ICD-10-CM | POA: Diagnosis not present

## 2020-11-23 DIAGNOSIS — Z0000 Encounter for general adult medical examination without abnormal findings: Secondary | ICD-10-CM | POA: Diagnosis not present

## 2021-01-24 DIAGNOSIS — Z6834 Body mass index (BMI) 34.0-34.9, adult: Secondary | ICD-10-CM | POA: Diagnosis not present

## 2021-01-24 DIAGNOSIS — G4700 Insomnia, unspecified: Secondary | ICD-10-CM | POA: Diagnosis not present

## 2021-01-24 DIAGNOSIS — F445 Conversion disorder with seizures or convulsions: Secondary | ICD-10-CM | POA: Diagnosis not present

## 2021-01-24 DIAGNOSIS — I10 Essential (primary) hypertension: Secondary | ICD-10-CM | POA: Diagnosis not present

## 2021-06-21 DIAGNOSIS — M5440 Lumbago with sciatica, unspecified side: Secondary | ICD-10-CM | POA: Diagnosis not present

## 2021-06-21 DIAGNOSIS — Z6836 Body mass index (BMI) 36.0-36.9, adult: Secondary | ICD-10-CM | POA: Diagnosis not present

## 2021-06-21 DIAGNOSIS — I10 Essential (primary) hypertension: Secondary | ICD-10-CM | POA: Diagnosis not present

## 2021-06-21 DIAGNOSIS — F445 Conversion disorder with seizures or convulsions: Secondary | ICD-10-CM | POA: Diagnosis not present

## 2021-06-21 DIAGNOSIS — Z87891 Personal history of nicotine dependence: Secondary | ICD-10-CM | POA: Diagnosis not present

## 2021-08-29 DIAGNOSIS — M25521 Pain in right elbow: Secondary | ICD-10-CM | POA: Diagnosis not present

## 2021-08-29 DIAGNOSIS — M25852 Other specified joint disorders, left hip: Secondary | ICD-10-CM | POA: Diagnosis not present

## 2021-08-29 DIAGNOSIS — M25552 Pain in left hip: Secondary | ICD-10-CM | POA: Diagnosis not present

## 2021-10-01 DIAGNOSIS — M25552 Pain in left hip: Secondary | ICD-10-CM | POA: Diagnosis not present

## 2021-10-01 DIAGNOSIS — R2689 Other abnormalities of gait and mobility: Secondary | ICD-10-CM | POA: Diagnosis not present

## 2021-10-01 DIAGNOSIS — M6281 Muscle weakness (generalized): Secondary | ICD-10-CM | POA: Diagnosis not present

## 2022-01-06 DIAGNOSIS — L255 Unspecified contact dermatitis due to plants, except food: Secondary | ICD-10-CM | POA: Diagnosis not present

## 2022-01-06 DIAGNOSIS — F445 Conversion disorder with seizures or convulsions: Secondary | ICD-10-CM | POA: Diagnosis not present

## 2022-01-06 DIAGNOSIS — I10 Essential (primary) hypertension: Secondary | ICD-10-CM | POA: Diagnosis not present

## 2022-01-06 DIAGNOSIS — F319 Bipolar disorder, unspecified: Secondary | ICD-10-CM | POA: Diagnosis not present

## 2022-01-06 DIAGNOSIS — M5440 Lumbago with sciatica, unspecified side: Secondary | ICD-10-CM | POA: Diagnosis not present

## 2022-04-01 DIAGNOSIS — Z419 Encounter for procedure for purposes other than remedying health state, unspecified: Secondary | ICD-10-CM | POA: Diagnosis not present

## 2022-05-02 DIAGNOSIS — Z419 Encounter for procedure for purposes other than remedying health state, unspecified: Secondary | ICD-10-CM | POA: Diagnosis not present

## 2022-06-01 DIAGNOSIS — Z419 Encounter for procedure for purposes other than remedying health state, unspecified: Secondary | ICD-10-CM | POA: Diagnosis not present

## 2022-07-02 DIAGNOSIS — Z419 Encounter for procedure for purposes other than remedying health state, unspecified: Secondary | ICD-10-CM | POA: Diagnosis not present

## 2022-07-21 DIAGNOSIS — F445 Conversion disorder with seizures or convulsions: Secondary | ICD-10-CM | POA: Diagnosis not present

## 2022-07-21 DIAGNOSIS — L02411 Cutaneous abscess of right axilla: Secondary | ICD-10-CM | POA: Diagnosis not present

## 2022-07-21 DIAGNOSIS — M5440 Lumbago with sciatica, unspecified side: Secondary | ICD-10-CM | POA: Diagnosis not present

## 2022-07-21 DIAGNOSIS — Z87891 Personal history of nicotine dependence: Secondary | ICD-10-CM | POA: Diagnosis not present

## 2022-07-21 DIAGNOSIS — F319 Bipolar disorder, unspecified: Secondary | ICD-10-CM | POA: Diagnosis not present

## 2022-07-21 DIAGNOSIS — I10 Essential (primary) hypertension: Secondary | ICD-10-CM | POA: Diagnosis not present

## 2022-08-01 DIAGNOSIS — Z419 Encounter for procedure for purposes other than remedying health state, unspecified: Secondary | ICD-10-CM | POA: Diagnosis not present

## 2022-08-13 DIAGNOSIS — H9201 Otalgia, right ear: Secondary | ICD-10-CM | POA: Diagnosis not present

## 2022-09-01 DIAGNOSIS — Z419 Encounter for procedure for purposes other than remedying health state, unspecified: Secondary | ICD-10-CM | POA: Diagnosis not present

## 2022-10-02 DIAGNOSIS — Z419 Encounter for procedure for purposes other than remedying health state, unspecified: Secondary | ICD-10-CM | POA: Diagnosis not present

## 2022-10-31 DIAGNOSIS — Z419 Encounter for procedure for purposes other than remedying health state, unspecified: Secondary | ICD-10-CM | POA: Diagnosis not present

## 2023-01-20 DIAGNOSIS — I10 Essential (primary) hypertension: Secondary | ICD-10-CM | POA: Diagnosis not present

## 2023-01-20 DIAGNOSIS — K625 Hemorrhage of anus and rectum: Secondary | ICD-10-CM | POA: Diagnosis not present

## 2023-01-20 DIAGNOSIS — F445 Conversion disorder with seizures or convulsions: Secondary | ICD-10-CM | POA: Diagnosis not present

## 2023-01-20 DIAGNOSIS — M5440 Lumbago with sciatica, unspecified side: Secondary | ICD-10-CM | POA: Diagnosis not present

## 2023-01-20 DIAGNOSIS — E7800 Pure hypercholesterolemia, unspecified: Secondary | ICD-10-CM | POA: Diagnosis not present

## 2023-01-20 DIAGNOSIS — F319 Bipolar disorder, unspecified: Secondary | ICD-10-CM | POA: Diagnosis not present

## 2023-01-20 DIAGNOSIS — Z87891 Personal history of nicotine dependence: Secondary | ICD-10-CM | POA: Diagnosis not present

## 2023-01-31 DIAGNOSIS — Z419 Encounter for procedure for purposes other than remedying health state, unspecified: Secondary | ICD-10-CM | POA: Diagnosis not present

## 2023-03-02 DIAGNOSIS — Z419 Encounter for procedure for purposes other than remedying health state, unspecified: Secondary | ICD-10-CM | POA: Diagnosis not present

## 2023-03-17 DIAGNOSIS — J4520 Mild intermittent asthma, uncomplicated: Secondary | ICD-10-CM | POA: Diagnosis not present

## 2023-03-17 DIAGNOSIS — J019 Acute sinusitis, unspecified: Secondary | ICD-10-CM | POA: Diagnosis not present

## 2023-04-02 DIAGNOSIS — Z419 Encounter for procedure for purposes other than remedying health state, unspecified: Secondary | ICD-10-CM | POA: Diagnosis not present

## 2023-04-07 DIAGNOSIS — J019 Acute sinusitis, unspecified: Secondary | ICD-10-CM | POA: Diagnosis not present

## 2023-04-07 DIAGNOSIS — J45901 Unspecified asthma with (acute) exacerbation: Secondary | ICD-10-CM | POA: Diagnosis not present

## 2023-05-03 DIAGNOSIS — Z419 Encounter for procedure for purposes other than remedying health state, unspecified: Secondary | ICD-10-CM | POA: Diagnosis not present

## 2023-06-02 DIAGNOSIS — Z419 Encounter for procedure for purposes other than remedying health state, unspecified: Secondary | ICD-10-CM | POA: Diagnosis not present

## 2023-06-24 DIAGNOSIS — H5213 Myopia, bilateral: Secondary | ICD-10-CM | POA: Diagnosis not present

## 2023-07-03 DIAGNOSIS — Z419 Encounter for procedure for purposes other than remedying health state, unspecified: Secondary | ICD-10-CM | POA: Diagnosis not present

## 2023-07-28 DIAGNOSIS — R6889 Other general symptoms and signs: Secondary | ICD-10-CM | POA: Diagnosis not present

## 2023-07-28 DIAGNOSIS — M5440 Lumbago with sciatica, unspecified side: Secondary | ICD-10-CM | POA: Diagnosis not present

## 2023-07-28 DIAGNOSIS — F319 Bipolar disorder, unspecified: Secondary | ICD-10-CM | POA: Diagnosis not present

## 2023-07-28 DIAGNOSIS — F445 Conversion disorder with seizures or convulsions: Secondary | ICD-10-CM | POA: Diagnosis not present

## 2023-07-28 DIAGNOSIS — E7800 Pure hypercholesterolemia, unspecified: Secondary | ICD-10-CM | POA: Diagnosis not present

## 2023-07-28 DIAGNOSIS — J019 Acute sinusitis, unspecified: Secondary | ICD-10-CM | POA: Diagnosis not present

## 2023-07-28 DIAGNOSIS — I10 Essential (primary) hypertension: Secondary | ICD-10-CM | POA: Diagnosis not present

## 2023-08-02 DIAGNOSIS — Z419 Encounter for procedure for purposes other than remedying health state, unspecified: Secondary | ICD-10-CM | POA: Diagnosis not present

## 2023-09-02 DIAGNOSIS — Z419 Encounter for procedure for purposes other than remedying health state, unspecified: Secondary | ICD-10-CM | POA: Diagnosis not present

## 2023-10-03 DIAGNOSIS — Z419 Encounter for procedure for purposes other than remedying health state, unspecified: Secondary | ICD-10-CM | POA: Diagnosis not present

## 2023-10-09 DIAGNOSIS — J101 Influenza due to other identified influenza virus with other respiratory manifestations: Secondary | ICD-10-CM | POA: Diagnosis not present

## 2023-10-09 DIAGNOSIS — R6889 Other general symptoms and signs: Secondary | ICD-10-CM | POA: Diagnosis not present

## 2023-10-31 DIAGNOSIS — Z419 Encounter for procedure for purposes other than remedying health state, unspecified: Secondary | ICD-10-CM | POA: Diagnosis not present

## 2023-12-12 DIAGNOSIS — Z419 Encounter for procedure for purposes other than remedying health state, unspecified: Secondary | ICD-10-CM | POA: Diagnosis not present

## 2024-01-11 DIAGNOSIS — Z419 Encounter for procedure for purposes other than remedying health state, unspecified: Secondary | ICD-10-CM | POA: Diagnosis not present

## 2024-02-11 DIAGNOSIS — Z419 Encounter for procedure for purposes other than remedying health state, unspecified: Secondary | ICD-10-CM | POA: Diagnosis not present

## 2024-03-12 DIAGNOSIS — Z419 Encounter for procedure for purposes other than remedying health state, unspecified: Secondary | ICD-10-CM | POA: Diagnosis not present

## 2024-03-29 DIAGNOSIS — I10 Essential (primary) hypertension: Secondary | ICD-10-CM | POA: Diagnosis not present

## 2024-03-29 DIAGNOSIS — E7800 Pure hypercholesterolemia, unspecified: Secondary | ICD-10-CM | POA: Diagnosis not present

## 2024-03-29 DIAGNOSIS — M5440 Lumbago with sciatica, unspecified side: Secondary | ICD-10-CM | POA: Diagnosis not present

## 2024-03-29 DIAGNOSIS — M79642 Pain in left hand: Secondary | ICD-10-CM | POA: Diagnosis not present

## 2024-03-29 DIAGNOSIS — F445 Conversion disorder with seizures or convulsions: Secondary | ICD-10-CM | POA: Diagnosis not present

## 2024-03-29 DIAGNOSIS — F319 Bipolar disorder, unspecified: Secondary | ICD-10-CM | POA: Diagnosis not present

## 2024-03-29 DIAGNOSIS — M79641 Pain in right hand: Secondary | ICD-10-CM | POA: Diagnosis not present

## 2024-04-12 DIAGNOSIS — Z419 Encounter for procedure for purposes other than remedying health state, unspecified: Secondary | ICD-10-CM | POA: Diagnosis not present

## 2024-05-13 DIAGNOSIS — Z419 Encounter for procedure for purposes other than remedying health state, unspecified: Secondary | ICD-10-CM | POA: Diagnosis not present

## 2024-06-01 DIAGNOSIS — M79641 Pain in right hand: Secondary | ICD-10-CM | POA: Diagnosis not present

## 2024-06-01 DIAGNOSIS — M79642 Pain in left hand: Secondary | ICD-10-CM | POA: Diagnosis not present

## 2024-07-13 DIAGNOSIS — Z419 Encounter for procedure for purposes other than remedying health state, unspecified: Secondary | ICD-10-CM | POA: Diagnosis not present

## 2024-07-20 DIAGNOSIS — G40909 Epilepsy, unspecified, not intractable, without status epilepticus: Secondary | ICD-10-CM | POA: Diagnosis not present

## 2024-07-20 DIAGNOSIS — M79642 Pain in left hand: Secondary | ICD-10-CM | POA: Diagnosis not present

## 2024-07-20 DIAGNOSIS — M79641 Pain in right hand: Secondary | ICD-10-CM | POA: Diagnosis not present

## 2024-08-05 DIAGNOSIS — M461 Sacroiliitis, not elsewhere classified: Secondary | ICD-10-CM | POA: Diagnosis not present

## 2024-08-12 DIAGNOSIS — Z419 Encounter for procedure for purposes other than remedying health state, unspecified: Secondary | ICD-10-CM | POA: Diagnosis not present
